# Patient Record
Sex: Female | Born: 1955
Health system: Southern US, Community
[De-identification: ages and names within clinical notes are randomized; demographics above are authoritative.]

## PROBLEM LIST (undated history)

## (undated) DIAGNOSIS — Z860100 Personal history of colon polyps, unspecified: Secondary | ICD-10-CM

## (undated) DIAGNOSIS — IMO0002 Reserved for concepts with insufficient information to code with codable children: Secondary | ICD-10-CM

## (undated) DIAGNOSIS — C4491 Basal cell carcinoma of skin, unspecified: Secondary | ICD-10-CM

## (undated) DIAGNOSIS — I10 Essential (primary) hypertension: Secondary | ICD-10-CM

## (undated) DIAGNOSIS — C801 Malignant (primary) neoplasm, unspecified: Secondary | ICD-10-CM

## (undated) DIAGNOSIS — K5790 Diverticulosis of intestine, part unspecified, without perforation or abscess without bleeding: Secondary | ICD-10-CM

## (undated) DIAGNOSIS — Z9889 Other specified postprocedural states: Secondary | ICD-10-CM

## (undated) DIAGNOSIS — E785 Hyperlipidemia, unspecified: Secondary | ICD-10-CM

## (undated) DIAGNOSIS — Z8619 Personal history of other infectious and parasitic diseases: Secondary | ICD-10-CM

## (undated) DIAGNOSIS — R87619 Unspecified abnormal cytological findings in specimens from cervix uteri: Secondary | ICD-10-CM

## (undated) DIAGNOSIS — Z859 Personal history of malignant neoplasm, unspecified: Secondary | ICD-10-CM

## (undated) HISTORY — DX: Essential (primary) hypertension: I10

## (undated) HISTORY — DX: Malignant (primary) neoplasm, unspecified: C80.1

## (undated) HISTORY — DX: Hyperlipidemia, unspecified: E78.5

## (undated) HISTORY — DX: Basal cell carcinoma of skin, unspecified: C44.91

## (undated) HISTORY — DX: Personal history of other infectious and parasitic diseases: Z86.19

## (undated) HISTORY — PX: SKIN BIOPSY: SHX1

---

## 1991-08-08 HISTORY — PX: TUBAL LIGATION: SHX77

## 2005-12-29 ENCOUNTER — Ambulatory Visit: Payer: Self-pay | Admitting: Gastroenterology

## 2006-01-17 ENCOUNTER — Ambulatory Visit: Payer: Self-pay | Admitting: Gastroenterology

## 2006-06-18 ENCOUNTER — Encounter: Admission: RE | Admit: 2006-06-18 | Discharge: 2006-06-18 | Payer: Self-pay | Admitting: Obstetrics and Gynecology

## 2011-03-20 ENCOUNTER — Encounter: Payer: Self-pay | Admitting: Gastroenterology

## 2011-09-05 ENCOUNTER — Encounter: Payer: Self-pay | Admitting: Gastroenterology

## 2011-09-05 ENCOUNTER — Ambulatory Visit (AMBULATORY_SURGERY_CENTER): Payer: BC Managed Care – PPO

## 2011-09-05 VITALS — Ht 64.0 in | Wt 163.8 lb

## 2011-09-05 DIAGNOSIS — Z800 Family history of malignant neoplasm of digestive organs: Secondary | ICD-10-CM

## 2011-09-05 DIAGNOSIS — Z86010 Personal history of colonic polyps: Secondary | ICD-10-CM

## 2011-09-05 MED ORDER — PEG-KCL-NACL-NASULF-NA ASC-C 100 G PO SOLR: 1.0000 | Freq: Once | ORAL | Status: AC

## 2011-09-08 HISTORY — PX: COLONOSCOPY W/ POLYPECTOMY: SHX1380

## 2011-09-19 ENCOUNTER — Encounter: Payer: Self-pay | Admitting: Gastroenterology

## 2011-09-19 ENCOUNTER — Ambulatory Visit (AMBULATORY_SURGERY_CENTER): Payer: BC Managed Care – PPO | Admitting: Gastroenterology

## 2011-09-19 DIAGNOSIS — D128 Benign neoplasm of rectum: Secondary | ICD-10-CM

## 2011-09-19 DIAGNOSIS — K5730 Diverticulosis of large intestine without perforation or abscess without bleeding: Secondary | ICD-10-CM

## 2011-09-19 DIAGNOSIS — D126 Benign neoplasm of colon, unspecified: Secondary | ICD-10-CM

## 2011-09-19 DIAGNOSIS — D129 Benign neoplasm of anus and anal canal: Secondary | ICD-10-CM

## 2011-09-19 DIAGNOSIS — Z86010 Personal history of colonic polyps: Secondary | ICD-10-CM

## 2011-09-19 DIAGNOSIS — Z1211 Encounter for screening for malignant neoplasm of colon: Secondary | ICD-10-CM

## 2011-09-19 DIAGNOSIS — Z800 Family history of malignant neoplasm of digestive organs: Secondary | ICD-10-CM

## 2011-09-19 MED ORDER — SODIUM CHLORIDE 0.9 % IV SOLN: 500.0000 mL | INTRAVENOUS | Status: DC

## 2011-09-19 NOTE — Op Note (Signed)
[facility] [address] [place], [place]  [address]  COLONOSCOPY PROCEDURE REPORT  PATIENT:  Brianna Conrad, Brianna Conrad  MR#:  [id number] BIRTHDATE:  02-11-1956, 55 yrs. old  GENDER:  female ENDOSCOPIST:  Rachael Fee, MD PROCEDURE DATE:  09/19/2011 PROCEDURE:  Colonoscopy with biopsy ASA CLASS:  Class II INDICATIONS:  Elevated Risk Screening, brother with colon cancer, personal history of HP polyps in 2007 MEDICATIONS:   Fentanyl 75 mcg IV, These medications were titrated to patient response per physician's verbal order, Versed 6 mg IV  DESCRIPTION OF PROCEDURE:   After the risks benefits and alternatives of the procedure were thoroughly explained, informed consent was obtained.  Digital rectal exam was performed and revealed no rectal masses.   The LB 180AL [id number] endoscope was introduced through the anus and advanced to the cecum, which was identified by both the appendix and ileocecal valve, without limitations.  The quality of the prep was good..  The instrument was then slowly withdrawn as the colon was fully examined. <<PROCEDUREIMAGES>> FINDINGS:  A diminutive polyp was found in the transverse colon. This was removed with forceps and sent to pathology (jar 1) (see image3).  Mild diverticulosis was found in the sigmoid to descending colon segments.  This was otherwise a normal examination of the colon (see image1, image2, and image4). Retroflexed views in the rectum revealed no abnormalities. COMPLICATIONS:  None  ENDOSCOPIC IMPRESSION: 1) Diminutive polyp in the transverse colon, this was removed and sent to pathology 2) Mild diverticulosis in the sigmoid to descending colon segments 3) Otherwise normal examination  RECOMMENDATIONS: 1) Given your significant family history of colon cancer, you should have a repeat colonoscopy in 5 years even if the polyp removed today is NOT precancerous. 2) You will receive a letter within 1-2 weeks with the results of your  biopsy as well as final recommendations. Please call my office if you have not received a letter after 3 weeks.  ______________________________ Rachael Fee, MD  cc: Herb Grays, MD  n. Rosalie Doctor:   Rachael Fee at 09/19/2011 09:59 AM  Loralee Pacas, [phone]

## 2011-09-19 NOTE — Patient Instructions (Signed)
Resume medications.Information given on polyps, diverticulosis and high fiber diet. D/C Information reviewed with family.

## 2011-09-19 NOTE — Progress Notes (Signed)
Patient did not experience any of the following events: a burn prior to discharge; a fall within the facility; wrong site/side/patient/procedure/implant event; or a hospital transfer or hospital admission upon discharge from the facility. (G8907) Patient did not have preoperative order for IV antibiotic SSI prophylaxis. (G8918)  

## 2011-09-20 ENCOUNTER — Telehealth: Payer: Self-pay | Admitting: *Deleted

## 2011-09-20 NOTE — Telephone Encounter (Signed)
  Follow up Call-  Call back number 09/19/2011  Post procedure Call Back phone  # [phone]--OK TO LEAVE MESS  Permission to leave phone message Yes     Patient questions:  Do you have a fever, pain , or abdominal swelling? no Pain Score  0 *  Have you tolerated food without any problems? yes  Have you been able to return to your normal activities? yes  Do you have any questions about your discharge instructions: Diet   no Medications  no Follow up visit  no  Do you have questions or concerns about your Care? No   Actions: * If pain score is 4 or above: No action needed, pain <4.

## 2011-09-26 ENCOUNTER — Encounter: Payer: Self-pay | Admitting: Gastroenterology

## 2012-08-07 DIAGNOSIS — C801 Malignant (primary) neoplasm, unspecified: Secondary | ICD-10-CM

## 2012-08-07 HISTORY — DX: Malignant (primary) neoplasm, unspecified: C80.1

## 2013-05-13 ENCOUNTER — Other Ambulatory Visit: Payer: Self-pay | Admitting: Obstetrics and Gynecology

## 2013-05-13 DIAGNOSIS — R928 Other abnormal and inconclusive findings on diagnostic imaging of breast: Secondary | ICD-10-CM

## 2013-05-26 ENCOUNTER — Ambulatory Visit
Admission: RE | Admit: 2013-05-26 | Discharge: 2013-05-26 | Disposition: A | Discharge status: Home / Self Care | Payer: BC Managed Care – PPO | Source: Ambulatory Visit | Attending: Obstetrics and Gynecology | Admitting: Obstetrics and Gynecology

## 2013-05-26 DIAGNOSIS — R928 Other abnormal and inconclusive findings on diagnostic imaging of breast: Secondary | ICD-10-CM

## 2013-06-18 ENCOUNTER — Encounter (HOSPITAL_BASED_OUTPATIENT_CLINIC_OR_DEPARTMENT_OTHER): Payer: Self-pay | Admitting: *Deleted

## 2013-06-18 NOTE — Progress Notes (Signed)
NPO AFTER MN WITH EXCEPTION CLEAR LIQUIDS UNTIL 0800 (NO CREAM/ MILK PRODUCTS). ARRIVE AT 1200. PRE-OP ORDERS PENDING. NEEDS ISTAT AND EKG. WILL TAKE LIPITOR AND TRICOR AM DOS W/ SIPS OF WATER.

## 2013-06-19 MED ORDER — DEXTROSE 5 % IV SOLN
2.0000 g | INTRAVENOUS | Status: AC
  Administered 2013-06-20: 2 g via INTRAVENOUS
  Filled 2013-06-19: qty 2

## 2013-06-19 NOTE — H&P (Addendum)
57 yo WF with pap smear showing AGUS presents for surgical management  PMHx: hypercholesterol & TG, HTN PSHx:  SVD x 3 All:  NKA Meds:  Atorvastatin, fenofibrate, HCTZ SHx: negative tobacco, etoh, ivdu  AF, VSS Gen - NAD ABd - soft, NT CV - RRR Lungs - clear Ext - nt  Pap  - AGUS  A/P:  AGUS pap Hysteroscopy, D&C, colposcopy with possible CKC Plan of care reviewed with pt, informed consent

## 2013-06-20 ENCOUNTER — Encounter (HOSPITAL_COMMUNITY): Payer: Self-pay | Admitting: *Deleted

## 2013-06-20 ENCOUNTER — Encounter (HOSPITAL_COMMUNITY): Payer: BC Managed Care – PPO | Admitting: Anesthesiology

## 2013-06-20 ENCOUNTER — Encounter (HOSPITAL_COMMUNITY)
Admission: RE | Disposition: A | Discharge status: Home / Self Care | Payer: Self-pay | Source: Ambulatory Visit | Attending: Obstetrics and Gynecology

## 2013-06-20 ENCOUNTER — Ambulatory Visit (HOSPITAL_BASED_OUTPATIENT_CLINIC_OR_DEPARTMENT_OTHER)
Admission: RE | Admit: 2013-06-20 | Discharge: 2013-06-20 | Disposition: A | Discharge status: Home / Self Care | Payer: BC Managed Care – PPO | Source: Ambulatory Visit | Attending: Obstetrics and Gynecology | Admitting: Obstetrics and Gynecology

## 2013-06-20 ENCOUNTER — Ambulatory Visit (HOSPITAL_COMMUNITY): Payer: BC Managed Care – PPO | Admitting: Anesthesiology

## 2013-06-20 DIAGNOSIS — N840 Polyp of corpus uteri: Secondary | ICD-10-CM | POA: Insufficient documentation

## 2013-06-20 DIAGNOSIS — R87619 Unspecified abnormal cytological findings in specimens from cervix uteri: Secondary | ICD-10-CM | POA: Insufficient documentation

## 2013-06-20 HISTORY — DX: Personal history of malignant neoplasm, unspecified: Z85.9

## 2013-06-20 HISTORY — DX: Personal history of colon polyps, unspecified: Z86.0100

## 2013-06-20 HISTORY — PX: CERVICAL CONIZATION W/BX: SHX1330

## 2013-06-20 HISTORY — DX: Personal history of malignant neoplasm, unspecified: Z98.890

## 2013-06-20 HISTORY — PX: HYSTEROSCOPY W/D&C: SHX1775

## 2013-06-20 HISTORY — DX: Personal history of colonic polyps: Z86.010

## 2013-06-20 HISTORY — DX: Reserved for concepts with insufficient information to code with codable children: IMO0002

## 2013-06-20 HISTORY — DX: Diverticulosis of intestine, part unspecified, without perforation or abscess without bleeding: K57.90

## 2013-06-20 HISTORY — DX: Unspecified abnormal cytological findings in specimens from cervix uteri: R87.619

## 2013-06-20 LAB — CBC
HCT: 41 % (ref 36.0–46.0)
Hemoglobin: 14.9 g/dL (ref 12.0–15.0)
MCH: 32.2 pg (ref 26.0–34.0)
RBC: 4.63 MIL/uL (ref 3.87–5.11)

## 2013-06-20 SURGERY — DILATATION AND CURETTAGE /HYSTEROSCOPY
Anesthesia: General | Site: Vagina | Wound class: Clean Contaminated

## 2013-06-20 SURGERY — DILATATION AND CURETTAGE /HYSTEROSCOPY
Anesthesia: Choice

## 2013-06-20 MED ORDER — LIDOCAINE HCL (CARDIAC) 20 MG/ML IV SOLN
INTRAVENOUS | Status: DC | PRN
  Administered 2013-06-20: 50 mg via INTRAVENOUS

## 2013-06-20 MED ORDER — ONDANSETRON HCL 4 MG/2ML IJ SOLN
INTRAMUSCULAR | Status: DC | PRN
  Administered 2013-06-20: 4 mg via INTRAVENOUS

## 2013-06-20 MED ORDER — FENTANYL CITRATE 0.05 MG/ML IJ SOLN
INTRAMUSCULAR | Status: DC | PRN
  Administered 2013-06-20: 50 ug via INTRAVENOUS
  Administered 2013-06-20 (×2): 25 ug via INTRAVENOUS

## 2013-06-20 MED ORDER — FENTANYL CITRATE 0.05 MG/ML IJ SOLN: 25.0000 ug | INTRAMUSCULAR | Status: DC | PRN

## 2013-06-20 MED ORDER — LACTATED RINGERS IV SOLN
INTRAVENOUS | Status: DC
  Administered 2013-06-20: 13:00:00 via INTRAVENOUS

## 2013-06-20 MED ORDER — FERRIC SUBSULFATE 259 MG/GM EX SOLN
CUTANEOUS | Status: AC
  Filled 2013-06-20: qty 8

## 2013-06-20 MED ORDER — PROPOFOL 10 MG/ML IV EMUL
INTRAVENOUS | Status: AC
  Filled 2013-06-20: qty 20

## 2013-06-20 MED ORDER — HYDROCODONE-IBUPROFEN 7.5-200 MG PO TABS: 1.0000 | ORAL_TABLET | ORAL | Status: DC | PRN

## 2013-06-20 MED ORDER — ACETAMINOPHEN 160 MG/5ML PO SOLN
ORAL | Status: AC
  Filled 2013-06-20: qty 20.3

## 2013-06-20 MED ORDER — IODINE STRONG (LUGOLS) 5 % PO SOLN
ORAL | Status: AC
  Filled 2013-06-20: qty 1

## 2013-06-20 MED ORDER — ACETAMINOPHEN 160 MG/5ML PO SOLN
960.0000 mg | Freq: Once | ORAL | Status: AC
  Administered 2013-06-20: 960 mg via ORAL

## 2013-06-20 MED ORDER — LACTATED RINGERS IR SOLN
Status: DC | PRN
  Administered 2013-06-20: 1

## 2013-06-20 MED ORDER — DEXAMETHASONE SODIUM PHOSPHATE 10 MG/ML IJ SOLN
INTRAMUSCULAR | Status: AC
  Filled 2013-06-20: qty 1

## 2013-06-20 MED ORDER — FENTANYL CITRATE 0.05 MG/ML IJ SOLN
INTRAMUSCULAR | Status: AC
  Filled 2013-06-20: qty 2

## 2013-06-20 MED ORDER — DEXAMETHASONE SODIUM PHOSPHATE 10 MG/ML IJ SOLN
INTRAMUSCULAR | Status: DC | PRN
  Administered 2013-06-20: 10 mg via INTRAVENOUS

## 2013-06-20 MED ORDER — LIDOCAINE HCL 1 % IJ SOLN
INTRAMUSCULAR | Status: DC | PRN
  Administered 2013-06-20: 10 mL

## 2013-06-20 MED ORDER — LIDOCAINE HCL 1 % IJ SOLN
INTRAMUSCULAR | Status: AC
  Filled 2013-06-20: qty 20

## 2013-06-20 MED ORDER — LIDOCAINE HCL (CARDIAC) 20 MG/ML IV SOLN
INTRAVENOUS | Status: AC
  Filled 2013-06-20: qty 5

## 2013-06-20 MED ORDER — MIDAZOLAM HCL 2 MG/2ML IJ SOLN
INTRAMUSCULAR | Status: AC
  Filled 2013-06-20: qty 2

## 2013-06-20 MED ORDER — LIDOCAINE-EPINEPHRINE 1 %-1:100000 IJ SOLN
INTRAMUSCULAR | Status: AC
  Filled 2013-06-20: qty 1

## 2013-06-20 MED ORDER — ACETIC ACID 5 % SOLN
Status: AC
  Filled 2013-06-20: qty 500

## 2013-06-20 MED ORDER — MIDAZOLAM HCL 5 MG/5ML IJ SOLN
INTRAMUSCULAR | Status: DC | PRN
  Administered 2013-06-20: 2 mg via INTRAVENOUS

## 2013-06-20 MED ORDER — PROPOFOL 10 MG/ML IV BOLUS
INTRAVENOUS | Status: DC | PRN
  Administered 2013-06-20: 200 mg via INTRAVENOUS

## 2013-06-20 MED ORDER — ONDANSETRON HCL 4 MG/2ML IJ SOLN
INTRAMUSCULAR | Status: AC
  Filled 2013-06-20: qty 2

## 2013-06-20 SURGICAL SUPPLY — 36 items
ABLATOR ENDOMETRIAL BIPOLAR (ABLATOR) IMPLANT
APPLICATOR COTTON TIP 6IN STRL (MISCELLANEOUS) IMPLANT
BLADE SURG 11 STRL SS (BLADE) ×2 IMPLANT
CANISTER SUCT 3000ML (MISCELLANEOUS) ×2 IMPLANT
CATH ROBINSON RED A/P 16FR (CATHETERS) ×2 IMPLANT
CATH THERMACHOICE III (CATHETERS) IMPLANT
CLOTH BEACON ORANGE TIMEOUT ST (SAFETY) ×2 IMPLANT
CONTAINER PREFILL 10% NBF 60ML (FORM) ×4 IMPLANT
COUNTER NEEDLE 1200 MAGNETIC (NEEDLE) IMPLANT
DRAPE HYSTEROSCOPY (DRAPE) ×2 IMPLANT
DRESSING TELFA 8X3 (GAUZE/BANDAGES/DRESSINGS) ×2 IMPLANT
ELECT BALL LEEP 5MM RED (ELECTRODE) ×4 IMPLANT
ELECT REM PT RETURN 9FT ADLT (ELECTROSURGICAL) ×2
ELECTRODE REM PT RTRN 9FT ADLT (ELECTROSURGICAL) ×1 IMPLANT
GLOVE BIO SURGEON STRL SZ 6.5 (GLOVE) ×2 IMPLANT
GLOVE BIOGEL PI IND STRL 7.0 (GLOVE) ×1 IMPLANT
GLOVE BIOGEL PI INDICATOR 7.0 (GLOVE) ×1
GOWN STRL REIN XL XLG (GOWN DISPOSABLE) ×4 IMPLANT
LOOP ANGLED CUTTING 22FR (CUTTING LOOP) IMPLANT
NEEDLE SPNL 22GX3.5 QUINCKE BK (NEEDLE) ×2 IMPLANT
NS IRRIG 1000ML POUR BTL (IV SOLUTION) ×2 IMPLANT
PACK HYSTEROSCOPY LF (CUSTOM PROCEDURE TRAY) IMPLANT
PACK VAGINAL MINOR WOMEN LF (CUSTOM PROCEDURE TRAY) ×6 IMPLANT
PAD OB MATERNITY 4.3X12.25 (PERSONAL CARE ITEMS) ×2 IMPLANT
PENCIL BUTTON HOLSTER BLD 10FT (ELECTRODE) ×2 IMPLANT
SCOPETTES 8  STERILE (MISCELLANEOUS)
SCOPETTES 8 STERILE (MISCELLANEOUS) IMPLANT
SPONGE SURGIFOAM ABS GEL 12-7 (HEMOSTASIS) IMPLANT
SUT VIC AB 0 CT1 27 (SUTURE) ×1
SUT VIC AB 0 CT1 27XBRD ANBCTR (SUTURE) ×1 IMPLANT
SUT VIC AB 2-0 CT1 (SUTURE) ×4 IMPLANT
SYR CONTROL 10ML LL (SYRINGE) ×2 IMPLANT
TOWEL OR 17X24 6PK STRL BLUE (TOWEL DISPOSABLE) ×4 IMPLANT
TUBING NON-CON 1/4 X 20 CONN (TUBING) IMPLANT
WATER STERILE IRR 1000ML POUR (IV SOLUTION) IMPLANT
YANKAUER SUCT BULB TIP NO VENT (SUCTIONS) ×2 IMPLANT

## 2013-06-20 NOTE — Transfer of Care (Signed)
Immediate Anesthesia Transfer of Care Note  Patient: Brianna Conrad  Procedure(s) Performed: Procedure(s): DILATATION AND CURETTAGE /HYSTEROSCOPY (N/A) CONIZATION CERVIX WITH BIOPSY (N/A)  Patient Location: PACU  Anesthesia Type:General  Level of Consciousness: awake, alert  and oriented  Airway & Oxygen Therapy: Patient Spontanous Breathing and Patient connected to nasal cannula oxygen  Post-op Assessment: Report given to PACU RN and Post -op Vital signs reviewed and stable  Post vital signs: Reviewed and stable  Complications: No apparent anesthesia complications

## 2013-06-20 NOTE — Anesthesia Postprocedure Evaluation (Signed)
  Anesthesia Post-op Note  Patient: Brianna Conrad  Procedure(s) Performed: Procedure(s): DILATATION AND CURETTAGE /HYSTEROSCOPY (N/A) CONIZATION CERVIX WITH BIOPSY (N/A)  Patient Location: PACU  Anesthesia Type:General  Level of Consciousness: awake, alert  and oriented  Airway and Oxygen Therapy: Patient Spontanous Breathing  Post-op Pain: none  Post-op Assessment: Post-op Vital signs reviewed, Patient's Cardiovascular Status Stable, Respiratory Function Stable, Patent Airway, No signs of Nausea or vomiting and Pain level controlled  Post-op Vital Signs: Reviewed and stable  Complications: No apparent anesthesia complications

## 2013-06-20 NOTE — Anesthesia Preprocedure Evaluation (Signed)
Anesthesia Evaluation  Patient identified by MRN, date of birth, ID band Patient awake    Reviewed: Allergy & Precautions, H&P , Patient's Chart, lab work & pertinent test results, reviewed documented beta blocker date and time   Airway Mallampati: II TM Distance: >3 FB Neck ROM: full    Dental no notable dental hx.    Pulmonary  breath sounds clear to auscultation  Pulmonary exam normal       Cardiovascular hypertension, On Medications Rhythm:regular Rate:Normal     Neuro/Psych    GI/Hepatic   Endo/Other    Renal/GU      Musculoskeletal   Abdominal   Peds  Hematology   Anesthesia Other Findings   Reproductive/Obstetrics                           Anesthesia Physical Anesthesia Plan  ASA: II  Anesthesia Plan:    Post-op Pain Management:    Induction: Intravenous  Airway Management Planned: LMA  Additional Equipment:   Intra-op Plan:   Post-operative Plan:   Informed Consent: I have reviewed the patients History and Physical, chart, labs and discussed the procedure including the risks, benefits and alternatives for the proposed anesthesia with the patient or authorized representative who has indicated his/her understanding and acceptance.   Dental Advisory Given and Dental advisory given  Plan Discussed with: CRNA and Surgeon  Anesthesia Plan Comments:         Anesthesia Quick Evaluation

## 2013-06-20 NOTE — Preoperative (Signed)
Beta Blockers   Reason not to administer Beta Blockers:Not Applicable 

## 2013-06-21 NOTE — Op Note (Signed)
NAMEAMELIA, Brianna Conrad               ACCOUNT NO.:  [id number]  MEDICAL RECORD NO.:  [id number]  LOCATION:  [facility]                          FACILITY:  [facility]  PHYSICIAN:  Zelphia Cairo, MD    DATE OF BIRTH:  [birth year]  DATE OF PROCEDURE:  06/20/2013 DATE OF DISCHARGE:  06/20/2013                              OPERATIVE REPORT   PREOPERATIVE DIAGNOSIS:  Atypical glandular cells of undetermined significance on her Pap smear.  POSTOPERATIVE DIAGNOSIS:  Atypical glandular cells of undetermined significance on her Pap smear, pathology pending.  PROCEDURE: 1. Cervical block. 2. Colposcopy with cervical biopsy. 3. Endocervical curettage. 4. Hysteroscopy. 5. Dilation and curettage. 6. Cone biopsy of cervix.  SURGEON:  Zelphia Cairo, MD  ANESTHESIA:  General with local.  COMPLICATIONS:  None.  SPECIMEN: 1. Endocervical curettage. 2. Cervical biopsy. 3. Endometrial curettage. 4. Cone biopsy of cervix.  CONDITION:  Stable to recovery room.  FLUID DEFICIT:  40 mL.  PROCEDURE:  The patient was taken to the operating room after informed consent was obtained.  She received general anesthesia and was placed in the dorsal lithotomy position using Allen stirrups.  An in- and out catheter was used to drain her bladder for an unmeasured amount of urine.  A speculum was placed in the vagina and the cervix was cleansed with acetic acid solution.  Colposcopy was performed.  The entire cervix could be well visualized.  The transition zone could not be well identified.  She had a hemorrhagic type lesion at 7 o'clock of the cervix which was biopsied and this appeared what could be almost looked like a nabothian cyst.  No acetowhite changes were noted, and the cervical curettage was performed.  The speculum was removed, and the patient was sterilely prepped with Betadine.  A sterile speculum was reinserted in the vagina and a cervical block was performed using 1% lidocaine plain.  A  single-tooth tenaculum was attached to the anterior lip of the cervix and the cervix was dilated using Pratt dilators.  Diagnostic hysteroscope was inserted and a survey was performed.  The endometrial lining did appear somewhat thickened but no visual masses were identified.  The hysteroscope was removed and a systematic curetting was performed throughout the endometrial cavity.  Specimen was placed on Telfa and passed off to be sent to pathology.  Decision was made to perform a cone biopsy.  The speculum was removed and a weighted speculum was placed in the posterior vagina.  A Deaver was placed anterior.  Retention sutures were placed at 3 o'clock and 9 o'clock.  A circumferential incision was made with a scalpel, and the cervical specimen was excised at the base using curved Mayo scissors.  The base and edges were then well cauterized using the Bovie.  Retention sutures were tied together.  The cervix was hemostatic.  Retractors were removed.  The patient was extubated and taken to the recovery room in stable condition. Sponge, lap, needle and instrument counts were correct x2.     Zelphia Cairo, MD     GA/MEDQ  D:  06/20/2013  T:  06/21/2013  Job:  [id number]

## 2013-06-23 ENCOUNTER — Encounter (HOSPITAL_COMMUNITY): Payer: Self-pay | Admitting: Obstetrics and Gynecology

## 2014-06-15 ENCOUNTER — Other Ambulatory Visit: Payer: Self-pay | Admitting: Obstetrics and Gynecology

## 2014-06-16 LAB — CYTOLOGY - PAP

## 2014-06-24 ENCOUNTER — Other Ambulatory Visit: Payer: Self-pay | Admitting: Obstetrics and Gynecology

## 2015-04-08 ENCOUNTER — Encounter: Payer: Self-pay | Admitting: Gastroenterology

## 2015-07-08 ENCOUNTER — Other Ambulatory Visit: Payer: Self-pay | Admitting: Obstetrics and Gynecology

## 2015-07-08 DIAGNOSIS — N644 Mastodynia: Secondary | ICD-10-CM

## 2015-07-14 ENCOUNTER — Ambulatory Visit
Admission: RE | Admit: 2015-07-14 | Discharge: 2015-07-14 | Disposition: A | Discharge status: Home / Self Care | Payer: BLUE CROSS/BLUE SHIELD | Source: Ambulatory Visit | Attending: Obstetrics and Gynecology | Admitting: Obstetrics and Gynecology

## 2015-07-14 DIAGNOSIS — N644 Mastodynia: Secondary | ICD-10-CM

## 2015-08-11 ENCOUNTER — Encounter: Payer: Self-pay | Admitting: Family Medicine

## 2015-08-11 ENCOUNTER — Ambulatory Visit (INDEPENDENT_AMBULATORY_CARE_PROVIDER_SITE_OTHER): Payer: BLUE CROSS/BLUE SHIELD | Admitting: Family Medicine

## 2015-08-11 VITALS — BP 164/79 | HR 74 | Temp 98.6°F | Resp 20 | Ht 64.0 in | Wt 167.8 lb

## 2015-08-11 DIAGNOSIS — I10 Essential (primary) hypertension: Secondary | ICD-10-CM | POA: Insufficient documentation

## 2015-08-11 DIAGNOSIS — E785 Hyperlipidemia, unspecified: Secondary | ICD-10-CM | POA: Diagnosis not present

## 2015-08-11 DIAGNOSIS — Z7689 Persons encountering health services in other specified circumstances: Secondary | ICD-10-CM | POA: Insufficient documentation

## 2015-08-11 DIAGNOSIS — Z7189 Other specified counseling: Secondary | ICD-10-CM

## 2015-08-11 DIAGNOSIS — Z0000 Encounter for general adult medical examination without abnormal findings: Secondary | ICD-10-CM | POA: Insufficient documentation

## 2015-08-11 MED ORDER — HYDROCHLOROTHIAZIDE 25 MG PO TABS: 25.0000 mg | ORAL_TABLET | Freq: Every day | ORAL | Status: DC

## 2015-08-11 NOTE — Patient Instructions (Signed)
Health Maintenance, Female Adopting a healthy lifestyle and getting preventive care can go a long way to promote health and wellness. Talk with your health care provider about what schedule of regular examinations is right for you. This is a good chance for you to check in with your provider about disease prevention and staying healthy. In between checkups, there are plenty of things you can do on your own. Experts have done a lot of research about which lifestyle changes and preventive measures are most likely to keep you healthy. Ask your health care provider for more information. WEIGHT AND DIET  Eat a healthy diet  Be sure to include plenty of vegetables, fruits, low-fat dairy products, and lean protein.  Do not eat a lot of foods high in solid fats, added sugars, or salt.  Get regular exercise. This is one of the most important things you can do for your health.  Most adults should exercise for at least 150 minutes each week. The exercise should increase your heart rate and make you sweat (moderate-intensity exercise).  Most adults should also do strengthening exercises at least twice a week. This is in addition to the moderate-intensity exercise.  Maintain a healthy weight  Body mass index (BMI) is a measurement that can be used to identify possible weight problems. It estimates body fat based on height and weight. Your health care provider can help determine your BMI and help you achieve or maintain a healthy weight.  For females 20 years of age and older:   A BMI below 18.5 is considered underweight.  A BMI of 18.5 to 24.9 is normal.  A BMI of 25 to 29.9 is considered overweight.  A BMI of 30 and above is considered obese.  Watch levels of cholesterol and blood lipids  You should start having your blood tested for lipids and cholesterol at 59 years of age, then have this test every 5 years.  You may need to have your cholesterol levels checked more often if:  Your lipid  or cholesterol levels are high.  You are older than 59 years of age.  You are at high risk for heart disease.  CANCER SCREENING   Lung Cancer  Lung cancer screening is recommended for adults 55-80 years old who are at high risk for lung cancer because of a history of smoking.  A yearly low-dose CT scan of the lungs is recommended for people who:  Currently smoke.  Have quit within the past 15 years.  Have at least a 30-pack-year history of smoking. A pack year is smoking an average of one pack of cigarettes a day for 1 year.  Yearly screening should continue until it has been 15 years since you quit.  Yearly screening should stop if you develop a health problem that would prevent you from having lung cancer treatment.  Breast Cancer  Practice breast self-awareness. This means understanding how your breasts normally appear and feel.  It also means doing regular breast self-exams. Let your health care provider know about any changes, no matter how small.  If you are in your 20s or 30s, you should have a clinical breast exam (CBE) by a health care provider every 1-3 years as part of a regular health exam.  If you are 40 or older, have a CBE every year. Also consider having a breast X-ray (mammogram) every year.  If you have a family history of breast cancer, talk to your health care provider about genetic screening.  If you   are at high risk for breast cancer, talk to your health care provider about having an MRI and a mammogram every year.  Breast cancer gene (BRCA) assessment is recommended for women who have family members with BRCA-related cancers. BRCA-related cancers include:  Breast.  Ovarian.  Tubal.  Peritoneal cancers.  Results of the assessment will determine the need for genetic counseling and BRCA1 and BRCA2 testing. Cervical Cancer Your health care provider may recommend that you be screened regularly for cancer of the pelvic organs (ovaries, uterus, and  vagina). This screening involves a pelvic examination, including checking for microscopic changes to the surface of your cervix (Pap test). You may be encouraged to have this screening done every 3 years, beginning at age 21.  For women ages 30-65, health care providers may recommend pelvic exams and Pap testing every 3 years, or they may recommend the Pap and pelvic exam, combined with testing for human papilloma virus (HPV), every 5 years. Some types of HPV increase your risk of cervical cancer. Testing for HPV may also be done on women of any age with unclear Pap test results.  Other health care providers may not recommend any screening for nonpregnant women who are considered low risk for pelvic cancer and who do not have symptoms. Ask your health care provider if a screening pelvic exam is right for you.  If you have had past treatment for cervical cancer or a condition that could lead to cancer, you need Pap tests and screening for cancer for at least 20 years after your treatment. If Pap tests have been discontinued, your risk factors (such as having a new sexual partner) need to be reassessed to determine if screening should resume. Some women have medical problems that increase the chance of getting cervical cancer. In these cases, your health care provider may recommend more frequent screening and Pap tests. Colorectal Cancer  This type of cancer can be detected and often prevented.  Routine colorectal cancer screening usually begins at 59 years of age and continues through 59 years of age.  Your health care provider may recommend screening at an earlier age if you have risk factors for colon cancer.  Your health care provider may also recommend using home test kits to check for hidden blood in the stool.  A small camera at the end of a tube can be used to examine your colon directly (sigmoidoscopy or colonoscopy). This is done to check for the earliest forms of colorectal  cancer.  Routine screening usually begins at age 50.  Direct examination of the colon should be repeated every 5-10 years through 59 years of age. However, you may need to be screened more often if early forms of precancerous polyps or small growths are found. Skin Cancer  Check your skin from head to toe regularly.  Tell your health care provider about any new moles or changes in moles, especially if there is a change in a mole's shape or color.  Also tell your health care provider if you have a mole that is larger than the size of a pencil eraser.  Always use sunscreen. Apply sunscreen liberally and repeatedly throughout the day.  Protect yourself by wearing long sleeves, pants, a wide-brimmed hat, and sunglasses whenever you are outside. HEART DISEASE, DIABETES, AND HIGH BLOOD PRESSURE   High blood pressure causes heart disease and increases the risk of stroke. High blood pressure is more likely to develop in:  People who have blood pressure in the high end   of the normal range (130-139/85-89 mm Hg).  People who are overweight or obese.  People who are African American.  If you are 38-23 years of age, have your blood pressure checked every 3-5 years. If you are 61 years of age or older, have your blood pressure checked every year. You should have your blood pressure measured twice--once when you are at a hospital or clinic, and once when you are not at a hospital or clinic. Record the average of the two measurements. To check your blood pressure when you are not at a hospital or clinic, you can use:  An automated blood pressure machine at a pharmacy.  A home blood pressure monitor.  If you are between 45 years and 39 years old, ask your health care provider if you should take aspirin to prevent strokes.  Have regular diabetes screenings. This involves taking a blood sample to check your fasting blood sugar level.  If you are at a normal weight and have a low risk for diabetes,  have this test once every three years after 59 years of age.  If you are overweight and have a high risk for diabetes, consider being tested at a younger age or more often. PREVENTING INFECTION  Hepatitis B  If you have a higher risk for hepatitis B, you should be screened for this virus. You are considered at high risk for hepatitis B if:  You were born in a country where hepatitis B is common. Ask your health care provider which countries are considered high risk.  Your parents were born in a high-risk country, and you have not been immunized against hepatitis B (hepatitis B vaccine).  You have HIV or AIDS.  You use needles to inject street drugs.  You live with someone who has hepatitis B.  You have had sex with someone who has hepatitis B.  You get hemodialysis treatment.  You take certain medicines for conditions, including cancer, organ transplantation, and autoimmune conditions. Hepatitis C  Blood testing is recommended for:  Everyone born from [birth year] through 1964.  Anyone with known risk factors for hepatitis C. Sexually transmitted infections (STIs)  You should be screened for sexually transmitted infections (STIs) including gonorrhea and chlamydia if:  You are sexually active and are younger than 59 years of age.  You are older than 59 years of age and your health care provider tells you that you are at risk for this type of infection.  Your sexual activity has changed since you were last screened and you are at an increased risk for chlamydia or gonorrhea. Ask your health care provider if you are at risk.  If you do not have HIV, but are at risk, it may be recommended that you take a prescription medicine daily to prevent HIV infection. This is called pre-exposure prophylaxis (PrEP). You are considered at risk if:  You are sexually active and do not regularly use condoms or know the HIV status of your partner(s).  You take drugs by injection.  You are sexually  active with a partner who has HIV. Talk with your health care provider about whether you are at high risk of being infected with HIV. If you choose to begin PrEP, you should first be tested for HIV. You should then be tested every 3 months for as long as you are taking PrEP.  PREGNANCY   If you are premenopausal and you may become pregnant, ask your health care provider about preconception counseling.  If you may  become pregnant, take 400 to 800 micrograms (mcg) of folic acid every day.  If you want to prevent pregnancy, talk to your health care provider about birth control (contraception). OSTEOPOROSIS AND MENOPAUSE   Osteoporosis is a disease in which the bones lose minerals and strength with aging. This can result in serious bone fractures. Your risk for osteoporosis can be identified using a bone density scan.  If you are 67 years of age or older, or if you are at risk for osteoporosis and fractures, ask your health care provider if you should be screened.  Ask your health care provider whether you should take a calcium or vitamin D supplement to lower your risk for osteoporosis.  Menopause may have certain physical symptoms and risks.  Hormone replacement therapy may reduce some of these symptoms and risks. Talk to your health care provider about whether hormone replacement therapy is right for you.  HOME CARE INSTRUCTIONS   Schedule regular health, dental, and eye exams.  Stay current with your immunizations.   Do not use any tobacco products including cigarettes, chewing tobacco, or electronic cigarettes.  If you are pregnant, do not drink alcohol.  If you are breastfeeding, limit how much and how often you drink alcohol.  Limit alcohol intake to no more than 1 drink per day for nonpregnant women. One drink equals 12 ounces of beer, 5 ounces of wine, or 1 ounces of hard liquor.  Do not use street drugs.  Do not share needles.  Ask your health care provider for help if  you need support or information about quitting drugs.  Tell your health care provider if you often feel depressed.  Tell your health care provider if you have ever been abused or do not feel safe at home.   This information is not intended to replace advice given to you by your health care provider. Make sure you discuss any questions you have with your health care provider.   Document Released: 02/06/2011 Document Revised: 08/14/2014 Document Reviewed: 06/25/2013 Elsevier Interactive Patient Education [employer].  Within the next week please have labs collected fasting. I will call you with those results once available.

## 2015-08-11 NOTE — Progress Notes (Signed)
Subjective:    Patient ID: Brianna Conrad, female    DOB: [birth year], 60 y.o.   MRN: [id number]  HPI   Patient presents for new patient establishment with no complaints. All past medical history, surgical history, allergies, family history, immunizations and social history was obtained from the patient today and entered into the electronic medical record. Records are requested from her prior PCP, and will be reviewed at the time they are received. All medical records will be updated at that time.  She has a past medical history of hypertension, and which she states she takes HCTZ 25 mg daily. Patient reports she has white coat syndrome, and her blood pressure is always elevated when attending doctor. She does check her blood pressure in the outpatient setting and states it's always "normal. "She does exercise but does not watch her diet very closely. She denies any chest pain, shortness of breath, visual changes, headaches or lower extremity edema.  Patient also has a history of hyperlipidemia, and she states she has not been taking either her medications. She was prescribed Lipitor 10 mg daily and fenofibrate 54 mg daily. Patient states all of her lab work was collected approximately 1 year ago in January. Patient is not fasting today. Patient does exercise, but does not watch her diet very closely.  Health maintenance:  Colonoscopy: fhx brother 3, personal history of colon polyps. Last colonoscopy 2013, told to return in 5 years. Due in 2018. Mammogram: No fhx, no abnl mammogram (has cyst). Last mammogram December 2016, continue every 2 year screening. Cervical cancer screening: Last Pap smear 2016. Records indicate ASCUS, negative HPV in the past. Patient follows with gynecology. Immunizations: Patient states she does not believe in a flu shot. Unknown tetanus or pneumonia vaccinations. Zostavax age 49, indicated. Infectious disease screening: Unknown infectious disease screening. requested  records. Patient has dental home.  Past Medical History  Diagnosis Date  . History of squamous cell carcinoma excision     RIGHT SHOULDER  . Atypical glandular cells on Pap smear   . History of colon polyps     BENIGN  2013  . Diverticulosis   . Vaginal delivery [id number], 1993  . Cancer ([place]) 2014    basal cell skin cancer  . History of chicken pox   . Hypertension   . Hyperlipidemia    Allergies  Allergen Reactions  . Amoxicillin Rash  . Penicillins Rash   Past Surgical History  Procedure Laterality Date  . Colonoscopy w/ polypectomy  09/2011  . Tubal ligation  1993    POST PARTUM  . Hysteroscopy w/d&c N/A 06/20/2013    Procedure: DILATATION AND CURETTAGE /HYSTEROSCOPY;  Surgeon: [name], MD;  Location: [facility];  Service: Gynecology;  Laterality: N/A;  . Cervical conization w/bx N/A 06/20/2013    Procedure: CONIZATION CERVIX WITH BIOPSY;  Surgeon: [name], MD;  Location: [facility];  Service: Gynecology;  Laterality: N/A;   Family History  Problem Relation Age of Onset  . Diabetes Father   . Colon cancer Brother 80  . Diabetes Paternal Uncle   . Diabetes Paternal Grandmother   . Stroke Paternal Grandfather    Social History   Social History  . Marital Status: Married    Spouse Name: N/A  . Number of Children: N/A  . Years of Education: N/A   Occupational History  . Not on file.   Social History Main Topics  . Smoking status: Never Smoker   . Smokeless tobacco: Never Used  .  Alcohol Use: Yes     Comment: RARE  . Drug Use: No  . Sexual Activity: Yes    Birth Control/ Protection: Surgical     Comment: Tubal ligation   Other Topics Concern  . Not on file   Social History Narrative   Married, [name]. 3 children.   Retired Film/video editor). College degree.    Drinks caffeinated beverages.   Wears her seatbelt, exercises at least 3 times a week, smoke detectors in the home.   No firearms in the home.   Feels safe in her relationships.      Review of Systems Negative, with the exception of above mentioned in HPI     Objective:   Physical Exam BP 164/79 mmHg  Pulse 74  Temp(Src) 98.6 F (37 C)  Resp 20  Ht 5\' 4"  (1.626 m)  Wt 167 lb 12 oz (76.091 kg)  BMI 28.78 kg/m2  SpO2 98%  LMP 08/07/2015 Gen: Afebrile. No acute distress. Nontoxic in appearance, well-developed, well-nourished, Caucasian female, pleasant. HENT: AT. [place]. Bilateral TM visualized and normal in appearance. MMM, no oral lesions.. Bilateral nares without erythema or swelling. Throat without erythema or exudates. Good dentition. Cough on exam. No hoarseness on exam. Eyes:Pupils Equal Round Reactive to light, Extraocular movements intact,  Conjunctiva without redness, discharge or icterus. Neck/lymp/endocrine: Supple, no lymphadenopathy, no thyromegaly CV: RRR no murmur appreciated, no edema, +2/4 P posterior tibialis pulses Chest: CTAB, no wheeze or crackles. Good air movement, normal respiratory effort. Abd: Soft. flAt. NTND. BS present. No Masses palpated. No rebound or guarding, no hepatosplenomegaly. MSK: No obvious deformities, no erythema, no soft tissue swelling, full range of motion. Neurovascularly intact distally. Skin: No rashes, purpura or petechiae.  Neuro:  Normal gait. PERLA. EOMi. Alert. Oriented x3 Cranial nerves II through XII intact. Muscle strength 5/5 upper and lower extremity. Psych: Normal affect, dress and demeanor. Normal speech. Normal thought content and judgment    Assessment & Plan:  Brianna Conrad is a 60 y.o. female present for establishment of care and annual physical. 1. Essential hypertension, benign - Patient encouraged to continue her HCTZ 25 mg daily, low-salt diet, continue exercising greater than 150 minutes a week. - Patient to monitor blood pressure at home, if elevated above 140/90 she is to make an appointment sooner to discuss her blood pressure regimen. - Future order CBC, CMP, TSH  2. Hyperlipidemia LDL  goal <100 - Patient currently not on medications. Unknown lipid panel levels.  - Patient to obtain lipid panel once fasting, scheduling a lab appointment. Future orders placed. - Future order lipid panel and A1c  3 Encounter for preventive health examination Health maintenance:  Colonoscopy: fhx brother 29, personal history of colon polyps. Last colonoscopy 2013, told to return in 5 years. Due in 2018. Mammogram: No fhx, no abnl mammogram (has cyst). Last mammogram December 2016, continue every 2 year screening. Cervical cancer screening: Last Pap smear 2016. Records indicate ASCUS, negative HPV in the past. Patient follows with gynecology. Immunizations: Patient states she does not believe in a flu shot. Unknown tetanus or pneumonia vaccinations. Zostavax age 23, indicated. Infectious disease screening: Unknown infectious disease screening. requested records. AVS on health maintenance parotid patient today. Patient was encouraged to take at least 800 units of vitamin D daily. Exercise greater than 150 minutes a week. Monitor her diet closely with low salt, low saturated fats, plenty of fresh fruits and vegetables. Once records are received, will update immunizations by nurse visit.

## 2015-08-16 ENCOUNTER — Other Ambulatory Visit: Payer: BLUE CROSS/BLUE SHIELD

## 2015-08-16 ENCOUNTER — Telehealth: Payer: Self-pay | Admitting: Family Medicine

## 2015-08-16 NOTE — Telephone Encounter (Signed)
Please call pt: 0 I have received her records and it does appear she is in need of the TDAP vaccination. If she desires this immunization she can receive by nurse visit.

## 2015-08-17 NOTE — Telephone Encounter (Signed)
Left message for patient on voicemail.

## 2015-08-18 ENCOUNTER — Telehealth: Payer: Self-pay | Admitting: Family Medicine

## 2015-08-18 ENCOUNTER — Other Ambulatory Visit (INDEPENDENT_AMBULATORY_CARE_PROVIDER_SITE_OTHER): Payer: BLUE CROSS/BLUE SHIELD

## 2015-08-18 DIAGNOSIS — Z23 Encounter for immunization: Secondary | ICD-10-CM | POA: Diagnosis not present

## 2015-08-18 DIAGNOSIS — Z7689 Persons encountering health services in other specified circumstances: Secondary | ICD-10-CM

## 2015-08-18 DIAGNOSIS — I10 Essential (primary) hypertension: Secondary | ICD-10-CM

## 2015-08-18 DIAGNOSIS — Z7189 Other specified counseling: Secondary | ICD-10-CM | POA: Diagnosis not present

## 2015-08-18 DIAGNOSIS — E785 Hyperlipidemia, unspecified: Secondary | ICD-10-CM

## 2015-08-18 DIAGNOSIS — Z0000 Encounter for general adult medical examination without abnormal findings: Secondary | ICD-10-CM

## 2015-08-18 LAB — COMPREHENSIVE METABOLIC PANEL
ALT: 24 U/L (ref 0–35)
AST: 17 U/L (ref 0–37)
Albumin: 4.5 g/dL (ref 3.5–5.2)
Alkaline Phosphatase: 52 U/L (ref 39–117)
BILIRUBIN TOTAL: 0.9 mg/dL (ref 0.2–1.2)
BUN: 23 mg/dL (ref 6–23)
CALCIUM: 9.8 mg/dL (ref 8.4–10.5)
CO2: 34 meq/L — AB (ref 19–32)
Chloride: 100 mEq/L (ref 96–112)
Creatinine, Ser: 0.83 mg/dL (ref 0.40–1.20)
GFR: 74.95 mL/min (ref >60.00)
Glucose, Bld: 100 mg/dL — ABNORMAL HIGH (ref 70–99)
Potassium: 4.1 mEq/L (ref 3.5–5.1)
Sodium: 140 mEq/L (ref 135–145)
Total Protein: 6.7 g/dL (ref 6.0–8.3)

## 2015-08-18 LAB — LIPID PANEL
Cholesterol: 272 mg/dL — ABNORMAL HIGH (ref 0–200)
HDL: 47.9 mg/dL (ref >39.00)
LDL CALC: 185 mg/dL — AB (ref 0–99)
NONHDL: 224.03
TRIGLYCERIDES: 197 mg/dL — AB (ref 0.0–149.0)
Total CHOL/HDL Ratio: 6
VLDL: 39.4 mg/dL (ref 0.0–40.0)

## 2015-08-18 LAB — CBC WITH DIFFERENTIAL/PLATELET
BASOS ABS: 0 10*3/uL (ref 0.0–0.1)
Basophils Relative: 0.2 % (ref 0.0–3.0)
Eosinophils Absolute: 0.1 10*3/uL (ref 0.0–0.7)
Eosinophils Relative: 1.3 % (ref 0.0–5.0)
HEMATOCRIT: 44 % (ref 36.0–46.0)
Hemoglobin: 14.7 g/dL (ref 12.0–15.0)
LYMPHS ABS: 2.3 10*3/uL (ref 0.7–4.0)
Lymphocytes Relative: 36.7 % (ref 12.0–46.0)
MCHC: 33.4 g/dL (ref 30.0–36.0)
MCV: 93.9 fl (ref 78.0–100.0)
MONO ABS: 0.3 10*3/uL (ref 0.1–1.0)
MONOS PCT: 5.5 % (ref 3.0–12.0)
NEUTROS ABS: 3.6 10*3/uL (ref 1.4–7.7)
Neutrophils Relative %: 56.3 % (ref 43.0–77.0)
Platelets: 218 10*3/uL (ref 150.0–400.0)
RBC: 4.68 Mil/uL (ref 3.87–5.11)
RDW: 12.2 % (ref 11.5–15.5)
WBC: 6.4 10*3/uL (ref 4.0–10.5)

## 2015-08-18 LAB — TSH: TSH: 2.2 u[IU]/mL (ref 0.35–4.50)

## 2015-08-18 LAB — HEMOGLOBIN A1C: HEMOGLOBIN A1C: 5.3 % (ref 4.6–6.5)

## 2015-08-18 NOTE — Telephone Encounter (Signed)
Pt came in 08/18/15 for labs and requested TDAP.  TDAP given per telephone note.

## 2015-08-18 NOTE — Telephone Encounter (Signed)
Please call patient: Her labs all appeared normal with the exception of her cholesterol.  - She had been on medications by her prior PCP, and she should be medicated with a statin. She was on low-dose Lipitor 10 mg by prior PCP notes, the patient states that she was not taking this routinely during her office visit. - She had also been on a low-dose fenofibrate for her elevated triglycerides, and I do not believe she was taking this either. Her triglycerides are elevated again today. - At the very least she can start the statin medication again, increase her exercise to greater than 150 minutes a week, dietary modifications with low saturated fat, fresh vegetables, lean meats and increased fiber/fish oil supplementation. - Is advised if she needs refills on Lipitor, and if she wants to restart fenofibrate for her elevated triglycerides, or she would rather take fish oil and increase fiber in her diet first, and then retest in 6 months. - Either way would want her to retest in 6 months.

## 2015-08-19 MED ORDER — ATORVASTATIN CALCIUM 10 MG PO TABS: 10.0000 mg | ORAL_TABLET | Freq: Every morning | ORAL | Status: DC

## 2015-08-19 NOTE — Telephone Encounter (Signed)
Patient states that she will start fish oil and diet / exercise recommendations.  She will begin lipitor again.  She will need a refill lipitor.  [facility] [place].

## 2015-08-19 NOTE — Telephone Encounter (Signed)
LMOM for pt to CB.  

## 2015-09-28 ENCOUNTER — Other Ambulatory Visit: Payer: Self-pay | Admitting: *Deleted

## 2015-09-28 MED ORDER — FENOFIBRATE 54 MG PO TABS: 54.0000 mg | ORAL_TABLET | Freq: Every morning | ORAL | Status: DC

## 2015-12-09 ENCOUNTER — Other Ambulatory Visit: Payer: Self-pay | Admitting: *Deleted

## 2015-12-09 MED ORDER — HYDROCHLOROTHIAZIDE 25 MG PO TABS: 25.0000 mg | ORAL_TABLET | Freq: Every day | ORAL | Status: DC

## 2016-01-10 ENCOUNTER — Other Ambulatory Visit: Payer: Self-pay | Admitting: Obstetrics and Gynecology

## 2016-01-10 DIAGNOSIS — N644 Mastodynia: Secondary | ICD-10-CM

## 2016-01-17 ENCOUNTER — Encounter: Payer: Self-pay | Admitting: Family Medicine

## 2016-01-17 ENCOUNTER — Ambulatory Visit (INDEPENDENT_AMBULATORY_CARE_PROVIDER_SITE_OTHER): Payer: BLUE CROSS/BLUE SHIELD | Admitting: Family Medicine

## 2016-01-17 ENCOUNTER — Telehealth: Payer: Self-pay | Admitting: Family Medicine

## 2016-01-17 VITALS — BP 129/77 | HR 68 | Temp 98.0°F | Resp 20 | Ht 64.0 in | Wt 165.8 lb

## 2016-01-17 DIAGNOSIS — I10 Essential (primary) hypertension: Secondary | ICD-10-CM | POA: Diagnosis not present

## 2016-01-17 DIAGNOSIS — E785 Hyperlipidemia, unspecified: Secondary | ICD-10-CM

## 2016-01-17 LAB — LIPID PANEL
Cholesterol: 171 mg/dL (ref 0–200)
HDL: 49.5 mg/dL (ref >39.00)
LDL CALC: 100 mg/dL — AB (ref 0–99)
NonHDL: 121.82
TRIGLYCERIDES: 108 mg/dL (ref 0.0–149.0)
Total CHOL/HDL Ratio: 3
VLDL: 21.6 mg/dL (ref 0.0–40.0)

## 2016-01-17 MED ORDER — ATORVASTATIN CALCIUM 10 MG PO TABS: 10.0000 mg | ORAL_TABLET | Freq: Every morning | ORAL | Status: DC

## 2016-01-17 MED ORDER — FENOFIBRATE 54 MG PO TABS: 54.0000 mg | ORAL_TABLET | Freq: Every morning | ORAL | Status: DC

## 2016-01-17 NOTE — Patient Instructions (Signed)

## 2016-01-17 NOTE — Telephone Encounter (Signed)
Please call pt: - her cholesterol levels look wonderful.  - refills on medications called in.

## 2016-01-17 NOTE — Progress Notes (Signed)
Patient ID: Brianna Conrad, female   DOB: November 25, 1955, 60 y.o.   MRN: [id number]   Subjective:    Patient ID: Brianna Conrad, female    DOB: [birth year], 60 y.o.   MRN: [id number]  HPI   Hyperlipidemia LDL goal <100: Patient restarted Lipitor 10 mg and fenofibrate, and tolerating. Attempting to watch diet more closely and exercising a little bit with yard work.FH stroke.   Essential hypertension, benign Patient brings her BP at home and all with in ranges of normal. She is starting to exercise more now that it is summer. She watches her sodium intake. She denies dizziness, LE, CP or shortness of breath.   Past Medical History  Diagnosis Date  . History of squamous cell carcinoma excision     RIGHT SHOULDER  . Atypical glandular cells on Pap smear   . History of colon polyps     BENIGN  2013  . Diverticulosis   . Vaginal delivery [id number], 1993  . Cancer ([place]) 2014    basal cell skin cancer  . History of chicken pox   . Hypertension   . Hyperlipidemia    Allergies  Allergen Reactions  . Amoxicillin Rash  . Penicillins Rash   Past Surgical History  Procedure Laterality Date  . Colonoscopy w/ polypectomy  09/2011  . Tubal ligation  1993    POST PARTUM  . Hysteroscopy w/d&c N/A 06/20/2013    Procedure: DILATATION AND CURETTAGE /HYSTEROSCOPY;  Surgeon: [name], MD;  Location: [facility];  Service: Gynecology;  Laterality: N/A;  . Cervical conization w/bx N/A 06/20/2013    Procedure: CONIZATION CERVIX WITH BIOPSY;  Surgeon: [name], MD;  Location: [facility];  Service: Gynecology;  Laterality: N/A;   Family History  Problem Relation Age of Onset  . Diabetes Father   . Colon cancer Brother 64  . Diabetes Paternal Uncle   . Diabetes Paternal Grandmother   . Stroke Paternal Grandfather    Social History   Social History  . Marital Status: Married    Spouse Name: N/A  . Number of Children: N/A  . Years of Education: N/A   Occupational History  . Not on file.    Social History Main Topics  . Smoking status: Never Smoker   . Smokeless tobacco: Never Used  . Alcohol Use: Yes     Comment: RARE  . Drug Use: No  . Sexual Activity: Yes    Birth Control/ Protection: Surgical     Comment: Tubal ligation   Other Topics Concern  . Not on file   Social History Narrative   Married, Brianna Conrad. 3 children.   Retired Film/video editor). College degree.    Drinks caffeinated beverages.   Wears her seatbelt, exercises at least 3 times a week, smoke detectors in the home.   No firearms in the home.   Feels safe in her relationships.     Medication List       This list is accurate as of: 01/17/16  8:19 AM.  Always use your most recent med list.               atorvastatin 10 MG tablet  Commonly known as:  LIPITOR  Take 1 tablet (10 mg total) by mouth every morning. Reported on 08/11/2015     fenofibrate 54 MG tablet  Take 1 tablet (54 mg total) by mouth every morning. Reported on 08/11/2015     hydrochlorothiazide 25 MG tablet  Commonly known as:  HYDRODIURIL  Take 1 tablet (  25 mg total) by mouth daily.         Review of Systems Negative, with the exception of above mentioned in HPI     Objective:   Physical Exam BP 129/77 mmHg  Pulse 68  Temp(Src) 98 F (36.7 C)  Resp 20  Ht 5\' 4"  (1.626 m)  Wt 165 lb 12.8 oz (75.206 kg)  BMI 28.45 kg/m2  SpO2 98% Gen: Afebrile. No acute distress. Nontoxic in appearance, well-developed, well-nourished, Caucasian female, pleasant. HENT: AT. [place]. Bilateral TM visualized and normal in appearance. MMM, no oral lesions.  Eyes:Pupils Equal Round Reactive to light, Extraocular movements intact,  Conjunctiva without redness, discharge or icterus. Neck/lymp/endocrine: Supple, no lymphadenopathy, no thyromegaly CV: RRR no murmur appreciated, no edema Chest: CTAB, no wheeze or crackles.  Abd: Soft. flAt. NTND. BS present. No Masses palpated. Skin: No rashes, purpura or petechiae.  Neuro:  Normal gait. PERLA. EOMi.  Alert. Oriented x3  Psych: Normal affect, dress and demeanor. Normal speech. Normal thought content and judgment    Assessment & Plan:  Yanai Rutkoski is a 60 y.o. female present for establishment of care and annual physical. 1. Essential hypertension, benign - Stable today.  - Continue HCTZ 25 mg daily, low-salt diet, continue exercising greater than 150 minutes a week. - F/U 6 mos  2. Hyperlipidemia LDL goal <100 - Lipitor and fenofibrate restarted last visit. Tolerating well.  - FH stroke. - Lipids collected today fasting. Will refill medications dependent on results in the event she needs higher dose.    F/u 6 mos  Electronically Signed by: Howard Pouch, DO [facility]

## 2016-01-17 NOTE — Telephone Encounter (Signed)
Left message with lab results on patient voice mail 

## 2016-01-19 ENCOUNTER — Ambulatory Visit
Admission: RE | Admit: 2016-01-19 | Discharge: 2016-01-19 | Disposition: A | Discharge status: Home / Self Care | Payer: BLUE CROSS/BLUE SHIELD | Source: Ambulatory Visit | Attending: Obstetrics and Gynecology | Admitting: Obstetrics and Gynecology

## 2016-01-19 DIAGNOSIS — N644 Mastodynia: Secondary | ICD-10-CM

## 2016-04-17 ENCOUNTER — Other Ambulatory Visit: Payer: Self-pay | Admitting: *Deleted

## 2016-04-17 MED ORDER — HYDROCHLOROTHIAZIDE 25 MG PO TABS: 25.0000 mg | ORAL_TABLET | Freq: Every day | ORAL | 3 refills | Status: DC

## 2016-04-17 NOTE — Telephone Encounter (Signed)
HCTZ refilled per refill protocol

## 2016-08-08 ENCOUNTER — Telehealth: Payer: Self-pay | Admitting: Family Medicine

## 2016-08-08 DIAGNOSIS — Z1321 Encounter for screening for nutritional disorder: Secondary | ICD-10-CM

## 2016-08-08 DIAGNOSIS — I10 Essential (primary) hypertension: Secondary | ICD-10-CM

## 2016-08-08 DIAGNOSIS — Z131 Encounter for screening for diabetes mellitus: Secondary | ICD-10-CM

## 2016-08-08 DIAGNOSIS — Z1329 Encounter for screening for other suspected endocrine disorder: Secondary | ICD-10-CM

## 2016-08-08 DIAGNOSIS — Z130 Encounter for screening for diseases of the blood and blood-forming organs and certain disorders involving the immune mechanism: Secondary | ICD-10-CM

## 2016-08-08 NOTE — Telephone Encounter (Signed)
Future orders placed for CPE

## 2016-08-11 ENCOUNTER — Other Ambulatory Visit (INDEPENDENT_AMBULATORY_CARE_PROVIDER_SITE_OTHER): Payer: BLUE CROSS/BLUE SHIELD

## 2016-08-11 DIAGNOSIS — Z1321 Encounter for screening for nutritional disorder: Secondary | ICD-10-CM

## 2016-08-11 DIAGNOSIS — Z131 Encounter for screening for diabetes mellitus: Secondary | ICD-10-CM | POA: Diagnosis not present

## 2016-08-11 DIAGNOSIS — Z130 Encounter for screening for diseases of the blood and blood-forming organs and certain disorders involving the immune mechanism: Secondary | ICD-10-CM | POA: Diagnosis not present

## 2016-08-11 DIAGNOSIS — I10 Essential (primary) hypertension: Secondary | ICD-10-CM

## 2016-08-11 DIAGNOSIS — Z1329 Encounter for screening for other suspected endocrine disorder: Secondary | ICD-10-CM | POA: Diagnosis not present

## 2016-08-11 LAB — COMPREHENSIVE METABOLIC PANEL
ALK PHOS: 54 U/L (ref 39–117)
ALT: 34 U/L (ref 0–35)
AST: 23 U/L (ref 0–37)
Albumin: 4.5 g/dL (ref 3.5–5.2)
BILIRUBIN TOTAL: 1.1 mg/dL (ref 0.2–1.2)
BUN: 19 mg/dL (ref 6–23)
CALCIUM: 9.5 mg/dL (ref 8.4–10.5)
CO2: 32 mEq/L (ref 19–32)
Chloride: 100 mEq/L (ref 96–112)
Creatinine, Ser: 0.81 mg/dL (ref 0.40–1.20)
GFR: 76.83 mL/min (ref >60.00)
GLUCOSE: 107 mg/dL — AB (ref 70–99)
POTASSIUM: 3.8 meq/L (ref 3.5–5.1)
Sodium: 139 mEq/L (ref 135–145)
TOTAL PROTEIN: 6.9 g/dL (ref 6.0–8.3)

## 2016-08-11 LAB — CBC WITH DIFFERENTIAL/PLATELET
BASOS ABS: 0 10*3/uL (ref 0.0–0.1)
Basophils Relative: 0.2 % (ref 0.0–3.0)
EOS PCT: 1 % (ref 0.0–5.0)
Eosinophils Absolute: 0.1 10*3/uL (ref 0.0–0.7)
HCT: 43 % (ref 36.0–46.0)
Hemoglobin: 14.8 g/dL (ref 12.0–15.0)
LYMPHS ABS: 3.5 10*3/uL (ref 0.7–4.0)
Lymphocytes Relative: 41.9 % (ref 12.0–46.0)
MCHC: 34.4 g/dL (ref 30.0–36.0)
MCV: 91.3 fl (ref 78.0–100.0)
MONOS PCT: 5.1 % (ref 3.0–12.0)
Monocytes Absolute: 0.4 10*3/uL (ref 0.1–1.0)
NEUTROS ABS: 4.4 10*3/uL (ref 1.4–7.7)
NEUTROS PCT: 51.8 % (ref 43.0–77.0)
Platelets: 221 10*3/uL (ref 150.0–400.0)
RBC: 4.71 Mil/uL (ref 3.87–5.11)
RDW: 12.7 % (ref 11.5–15.5)
WBC: 8.5 10*3/uL (ref 4.0–10.5)

## 2016-08-11 LAB — VITAMIN D 25 HYDROXY (VIT D DEFICIENCY, FRACTURES): VITD: 30.72 ng/mL (ref 30.00–100.00)

## 2016-08-11 LAB — TSH: TSH: 2.5 u[IU]/mL (ref 0.35–4.50)

## 2016-08-11 LAB — HEMOGLOBIN A1C: HEMOGLOBIN A1C: 5.5 % (ref 4.6–6.5)

## 2016-08-12 ENCOUNTER — Other Ambulatory Visit: Payer: Self-pay | Admitting: Family Medicine

## 2016-08-14 ENCOUNTER — Other Ambulatory Visit: Payer: Self-pay | Admitting: Family Medicine

## 2016-08-14 MED ORDER — ATORVASTATIN CALCIUM 10 MG PO TABS
10.0000 mg | ORAL_TABLET | Freq: Every morning | ORAL | 0 refills | Status: DC
Start: 2016-08-14 — End: 2016-09-16

## 2016-08-14 MED ORDER — FENOFIBRATE 54 MG PO TABS: 54.0000 mg | ORAL_TABLET | Freq: Every morning | ORAL | 0 refills | Status: DC

## 2016-08-14 NOTE — Telephone Encounter (Signed)
[facility]  Please advise.

## 2016-08-16 ENCOUNTER — Encounter: Payer: Self-pay | Admitting: Family Medicine

## 2016-08-16 ENCOUNTER — Ambulatory Visit (INDEPENDENT_AMBULATORY_CARE_PROVIDER_SITE_OTHER): Payer: BLUE CROSS/BLUE SHIELD | Admitting: Family Medicine

## 2016-08-16 VITALS — BP 169/90 | HR 76 | Temp 97.9°F | Resp 20 | Ht 64.0 in | Wt 171.8 lb

## 2016-08-16 DIAGNOSIS — Z0000 Encounter for general adult medical examination without abnormal findings: Secondary | ICD-10-CM | POA: Diagnosis not present

## 2016-08-16 DIAGNOSIS — Z6829 Body mass index (BMI) 29.0-29.9, adult: Secondary | ICD-10-CM

## 2016-08-16 DIAGNOSIS — W57XXXD Bitten or stung by nonvenomous insect and other nonvenomous arthropods, subsequent encounter: Secondary | ICD-10-CM

## 2016-08-16 DIAGNOSIS — Z1159 Encounter for screening for other viral diseases: Secondary | ICD-10-CM | POA: Diagnosis not present

## 2016-08-16 DIAGNOSIS — M25522 Pain in left elbow: Secondary | ICD-10-CM

## 2016-08-16 DIAGNOSIS — R0989 Other specified symptoms and signs involving the circulatory and respiratory systems: Secondary | ICD-10-CM | POA: Diagnosis not present

## 2016-08-16 DIAGNOSIS — Z114 Encounter for screening for human immunodeficiency virus [HIV]: Secondary | ICD-10-CM | POA: Diagnosis not present

## 2016-08-16 DIAGNOSIS — E785 Hyperlipidemia, unspecified: Secondary | ICD-10-CM

## 2016-08-16 DIAGNOSIS — I10 Essential (primary) hypertension: Secondary | ICD-10-CM

## 2016-08-16 LAB — HIV ANTIBODY (ROUTINE TESTING W REFLEX): HIV: NONREACTIVE

## 2016-08-16 NOTE — Progress Notes (Signed)
Subjective:    Patient ID: Brianna Conrad, female    DOB: 1956-07-19, 61 y.o.   MRN: [id number]  HPI  Patient Care Team    Relationship Specialty Notifications Start End  Ma Hillock, DO PCP - General Family Medicine  07/14/15   Milus Banister, MD Attending Physician Gastroenterology  08/16/16   [name], MD Consulting Physician Obstetrics and Gynecology  08/16/16     Patient presents for CPE.  All past medical history, surgical history, allergies, family history, immunizations and social history was obtained from the patient today and entered into the electronic medical record. All medical records will be updated at that time.  Tick bite: Patient reports having a tick bite sometime back in June, location was on her right medial upper thigh. She states she did not receive treatment for this tick bite. She then experienced a rash along her beltline, that helped resolved. Again she was not seen for the rash. She now has noticed left elbow pain and swelling and other arthritic pain, and is wondering if this could be sequela from the tick bite in June.  Health maintenance:  Colonoscopy: fhx brother 31, personal history of colon polyps. Last colonoscopy 2013, told to return in 5 years. Due in 2018. Dr. [name].  Mammogram: No fhx, no abnl mammogram (has cyst). Last mammogram June 2017 with rpt in 6 months. Cyst in right breast being monitored.  Cervical cancer screening: Last Pap smear 2016. Records indicate ASCUS, negative HPV in the past. Patient follows with gynecology. Dr. [name].  Immunizations: Patient states she does not believe in a flu shot, declined. Zostavax age 30, indicated. Infectious disease screening: HIV and hepatitis C obtained today, patient agreeable. Patient has dental home. No admissions.   Depression screen [facility] 2/9 08/16/2016 08/11/2015  Decreased Interest 0 0  Down, Depressed, Hopeless 0 0  PHQ - 2 Score 0 0   Current Exercise Habits: The patient does not  participate in regular exercise at present Exercise limited by: None identified   Past Medical History:  Diagnosis Date  . Atypical glandular cells on Pap smear   . Cancer ([place]) 2014   basal cell skin cancer  . Diverticulosis   . History of chicken pox   . History of colon polyps    BENIGN  2013  . History of squamous cell carcinoma excision    RIGHT SHOULDER  . Hyperlipidemia   . Hypertension   . Vaginal delivery [id number], 1993   Allergies  Allergen Reactions  . Amoxicillin Rash  . Penicillins Rash   Past Surgical History:  Procedure Laterality Date  . CERVICAL CONIZATION W/BX N/A 06/20/2013   Procedure: CONIZATION CERVIX WITH BIOPSY;  Surgeon: [name], MD;  Location: [facility];  Service: Gynecology;  Laterality: N/A;  . COLONOSCOPY W/ POLYPECTOMY  09/2011  . HYSTEROSCOPY W/D&C N/A 06/20/2013   Procedure: DILATATION AND CURETTAGE /HYSTEROSCOPY;  Surgeon: [name], MD;  Location: [facility];  Service: Gynecology;  Laterality: N/A;  . TUBAL LIGATION  1993   POST PARTUM   Family History  Problem Relation Age of Onset  . Diabetes Father   . Colon cancer Brother 64  . Diabetes Paternal Uncle   . Diabetes Paternal Grandmother   . Stroke Paternal Grandfather    Social History   Social History  . Marital status: Married    Spouse name: N/A  . Number of children: N/A  . Years of education: N/A   Occupational History  . Not on file.  Social History Main Topics  . Smoking status: Never Smoker  . Smokeless tobacco: Never Used  . Alcohol use Yes     Comment: RARE  . Drug use: No  . Sexual activity: Yes    Birth control/ protection: Surgical     Comment: Tubal ligation   Other Topics Concern  . Not on file   Social History Narrative   Married, Brianna Conrad. 3 children.   Retired Film/video editor). College degree.    Drinks caffeinated beverages.   Wears her seatbelt, exercises at least 3 times a week, smoke detectors in the home.   No firearms in the home.    Feels safe in her relationships.     Review of Systems Negative, with the exception of above mentioned in HPI     Objective:   Physical Exam BP (!) 169/90 (BP Location: Right Arm, Patient Position: Sitting, Cuff Size: Normal)   Pulse 76   Temp 97.9 F (36.6 C)   Resp 20   Ht 5\' 4"  (1.626 m)   Wt 171 lb 12 oz (77.9 kg)   SpO2 99%   BMI 29.48 kg/m   Gen: Afebrile. No acute distress. Nontoxic in appearance, well-developed, well-nourished, female. HENT: AT. [place]. Bilateral TM visualized and normal in appearance. MMM. Bilateral nares within normal limits. Throat without erythema or exudates. No cough Eyes:Pupils Equal Round Reactive to light, Extraocular movements intact,  Conjunctiva without redness, discharge or icterus. Neck/lymp/endocrine: Supple, no lymphadenopathy, no thyromegaly CV: RRR no murmur identified, no edema, +2/4 P posterior tibialis pulses. Left carotid bruit identified. Chest: CTAB, no wheeze or crackles Abd: Soft. Round. NTND. BS present. No Masses palpated.  MSK: No erythema, mild soft tissue swelling medial left elbow. Full range of motion. Neurovascularly intact distally. Skin: No rashes, purpura or petechiae.  Neuro:  Normal gait. PERLA. EOMi. Alert. Oriented. Cranial nerves II through XII intact. Muscle strength 5/5 upper and lower extremity. DTRs equal bilaterally. Psych: Normal affect, dress and demeanor. Normal speech. Normal thought content and judgment.    Assessment & Plan:  Lacandice Goga is a 61 y.o. female present for CPE. Encounter for preventive health examination Insurance provider with AVS information on routine health maintenance. She was encouraged to exercise greater than 150 minutes a week and maintain a healthy diet. Colonoscopy: fhx brother 32, personal history of colon polyps. Last colonoscopy 2013, told to return in 5 years. Due in 2018. Dr. [name].  Mammogram: No fhx, no abnl mammogram (has cyst). Last mammogram June 2017 with rpt in 6  months being scheduled. Cyst in right breast being monitored.  Cervical cancer screening: Last Pap smear 2016. Records indicate ASCUS, negative HPV in the past. Patient follows with gynecology. Dr. [name].  Immunizations: Patient states she does not believe in a flu shot, declined. Zostavax age 33, indicated. Infectious disease screening: HIV and hepatitis C obtained today, patient agreeable.  Tick bite, subsequent encounter/Elbow pain - Discussed tickborne illnesses with patient today, will test for [facility] spotted and Lyme disease. - Lyme Aby, Western Blot IgG & IgM w/bands - [place] spotted fvr abs pnl(IgG+IgM) - NSAIDs advised for discomfort  Encounter for screening for HIV - HIV antibody (with reflex) Need for hepatitis C screening test - Hepatitis C antibody  Left carotid bruit - New. Pt endorses occasional dizziness/presyncope sx on occasions.  - US Carotid Duplex Bilateral; Future  Essential hypertension, benign - Elevated today. Patient reports normal blood pressures at home. She is in pain today which may be driving  up her blood pressure.  -She is to monitor her blood pressure at home, if routinely above 140/80 been she will need to be seen sooner for her blood pressure rechecked. - Low-salt diet, increased exercise - Continue HCTZ 25 mg daily  Hyperlipidemia LDL goal <100/BMI 29 - Continue fenofibrate and Lipitor. - Stroke history in family - Exercise greater than 150 minutes a week, high fiber/low saturated fat diet.  Electronically Signed by: Howard Pouch, DO [facility] [place]

## 2016-08-16 NOTE — Patient Instructions (Addendum)
Take 2000u daily of vit d. We will call you with lab results once they are available.  You will be called to schedule [place] of neck.  Take aleve for next few days with heat for back.  Monitor BP after pain decreases, if > 140/80 routinely then We will need to see you.      Preventive Care 40-64 Years, Female Preventive care refers to lifestyle choices and visits with your health care provider that can promote health and wellness. What does preventive care include?  A yearly physical exam. This is also called an annual well check.  Dental exams once or twice a year.  Routine eye exams. Ask your health care provider how often you should have your eyes checked.  Personal lifestyle choices, including:  Daily care of your teeth and gums.  Regular physical activity.  Eating a healthy diet.  Avoiding tobacco and drug use.  Limiting alcohol use.  Practicing safe sex.  Taking low-dose aspirin daily starting at age 25.  Taking vitamin and mineral supplements as recommended by your health care provider. What happens during an annual well check? The services and screenings done by your health care provider during your annual well check will depend on your age, overall health, lifestyle risk factors, and family history of disease. Counseling  Your health care provider may ask you questions about your:  Alcohol use.  Tobacco use.  Drug use.  Emotional well-being.  Home and relationship well-being.  Sexual activity.  Eating habits.  Work and work Statistician.  Method of birth control.  Menstrual cycle.  Pregnancy history. Screening  You may have the following tests or measurements:  Height, weight, and BMI.  Blood pressure.  Lipid and cholesterol levels. These may be checked every 5 years, or more frequently if you are over 80 years old.  Skin check.  Lung cancer screening. You may have this screening every year starting at age 59 if you have a 30-pack-year  history of smoking and currently smoke or have quit within the past 15 years.  Fecal occult blood test (FOBT) of the stool. You may have this test every year starting at age 37.  Flexible sigmoidoscopy or colonoscopy. You may have a sigmoidoscopy every 5 years or a colonoscopy every 10 years starting at age 28.  Hepatitis C blood test.  Hepatitis B blood test.  Sexually transmitted disease (STD) testing.  Diabetes screening. This is done by checking your blood sugar (glucose) after you have not eaten for a while (fasting). You may have this done every 1-3 years.  Mammogram. This may be done every 1-2 years. Talk to your health care provider about when you should start having regular mammograms. This may depend on whether you have a family history of breast cancer.  BRCA-related cancer screening. This may be done if you have a family history of breast, ovarian, tubal, or peritoneal cancers.  Pelvic exam and Pap test. This may be done every 3 years starting at age 37. Starting at age 51, this may be done every 5 years if you have a Pap test in combination with an HPV test.  Bone density scan. This is done to screen for osteoporosis. You may have this scan if you are at high risk for osteoporosis. Discuss your test results, treatment options, and if necessary, the need for more tests with your health care provider. Vaccines  Your health care provider may recommend certain vaccines, such as:  Influenza vaccine. This is recommended every year.  Tetanus, diphtheria, and acellular pertussis (Tdap, Td) vaccine. You may need a Td booster every 10 years.  Varicella vaccine. You may need this if you have not been vaccinated.  Zoster vaccine. You may need this after age 36.  Measles, mumps, and rubella (MMR) vaccine. You may need at least one dose of MMR if you were born in 1957 or later. You may also need a second dose.  Pneumococcal 13-valent conjugate (PCV13) vaccine. You may need this if  you have certain conditions and were not previously vaccinated.  Pneumococcal polysaccharide (PPSV23) vaccine. You may need one or two doses if you smoke cigarettes or if you have certain conditions.  Meningococcal vaccine. You may need this if you have certain conditions.  Hepatitis A vaccine. You may need this if you have certain conditions or if you travel or work in places where you may be exposed to hepatitis A.  Hepatitis B vaccine. You may need this if you have certain conditions or if you travel or work in places where you may be exposed to hepatitis B.  Haemophilus influenzae type b (Hib) vaccine. You may need this if you have certain conditions. Talk to your health care provider about which screenings and vaccines you need and how often you need them. This information is not intended to replace advice given to you by your health care provider. Make sure you discuss any questions you have with your health care provider. Document Released: 08/20/2015 Document Revised: 04/12/2016 Document Reviewed: 05/25/2015 Elsevier Interactive Patient Education  2017 [employer].   Please help [place] help you:  It is a privilege to be able to take care of great patients such as yourself. We are honored you have chosen [facility] for your Primary Care home. Below you will find basic instructions that you may need to access in the future. Please help [place] help you by reading the instructions, which cover many of the frequent questions we experience.   Prescription refills and request:  -In order to allow more efficient response time, please call your pharmacy for all refills. They will forward the request electronically to [place]. This allows for the quickest possible response. Request left on a nurse line can take longer to refill, since these are checked as time allows between office patients and other phone calls.  - refill request can take up to 3-5 working days to complete.  - If request is sent  electronically and request is appropiate, it is usually completed in 1-2 business days.  - all patients will need to be seen routinely for all chronic medical conditions requiring prescription medications (see follow-up below). If you are overdue for follow up on your condition, you will be asked to make an appointment and we will call in enough medication to cover you until your appointment (up to 30 days).  - all controlled substances will require a face to face visit to request/refill.  - if you desire your prescriptions to go through a new pharmacy, and have an active script at original pharmacy, you will need to call your pharmacy and have scripts transferred to new pharmacy. This is completed between the pharmacy locations and not by your provider.    Results: If any images or labs were ordered, it can take up to 1 week to get results depending on the test ordered and the lab/facility running and resulting the test. - Normal or stable results, which do not need further discussion, will be released to your mychart immediately  with attached note to you. A call will not be generated for normal results. Please make certain to sign up for mychart. If you have questions on how to activate your mychart you can call the front office.  - If your results need further discussion, our office will attempt to contact you via phone, and if unable to reach you after 2 attempts, we will release your abnormal result to your mychart with instructions.  - All results will be automatically released in mychart after 1 week.  - Your provider will provide you with explanation and instruction on all relevant material in your results. Please keep in mind, results and labs may appear confusing or abnormal to the untrained eye, but it does not mean they are actually abnormal for you personally. If you have any questions about your results that are not covered, or you desire more detailed explanation than what was provided, you  should make an appointment with your provider to do so.   Our office handles many outgoing and incoming calls daily. If we have not contacted you within 1 week about your results, please check your mychart to see if there is a message first and if not, then contact our office.  In helping with this matter, you help decrease call volume, and therefore allow [place] to be able to respond to patients needs more efficiently.   Acute office visits (sick visit):  An acute visit is intended for a new problem and are scheduled in shorter time slots to allow schedule openings for patients with new problems. This is the appropriate visit to discuss a new problem. In order to provide you with excellent quality medical care with proper time for you to explain your problem, have an exam and receive treatment with instructions, these appointments should be limited to one new problem per visit. If you experience a new problem, in which you desire to be addressed, please make an acute office visit, we save openings on the schedule to accommodate you. Please do not save your new problem for any other type of visit, let us take care of it properly and quickly for you.   Follow up visits:  Depending on your condition(s) your provider will need to see you routinely in order to provide you with quality care and prescribe medication(s). Most chronic conditions (Example: hypertension, Diabetes, depression/anxiety... etc), require visits a couple times a year. Your provider will instruct you on proper follow up for your personal medical conditions and history. Please make certain to make follow up appointments for your condition as instructed. Failing to do so could result in lapse in your medication treatment/refills. If you request a refill, and are overdue to be seen on a condition, we will always provide you with a 30 day script (once) to allow you time to schedule.    Medicare wellness (well visit): - we have a wonderful Nurse  Maudie Mercury), that will meet with you and provide you will yearly medicare wellness visits. These visits should occur yearly (can not be scheduled less than 1 calendar year apart) and cover preventive health, immunizations, advance directives and screenings you are entitled to yearly through your medicare benefits. Do not miss out on your entitled benefits, this is when medicare will pay for these benefits to be ordered for you.  These are strongly encouraged by your provider and is the appropriate type of visit to make certain you are up to date with all preventive health benefits. If you have not had  your medicare wellness exam in the last 12 months, please make certain to schedule one by calling the office and schedule your medicare wellness with Maudie Mercury as soon as possible.   Yearly physical (well visit):  - Adults are recommended to be seen yearly for physicals. Check with your insurance and date of your last physical, most insurances require one calendar year between physicals. Physicals include all preventive health topics, screenings, medical exam and labs that are appropriate for gender/age and history. You may have fasting labs needed at this visit. This is a well visit (not a sick visit), acute topics should not be covered during this visit.  - Pediatric patients are seen more frequently when they are younger. Your provider will advise you on well child visit timing that is appropriate for your their age. - This is not a medicare wellness visit. Medicare wellness exams do not have an exam portion to the visit. Some medicare companies allow for a physical, some do not allow a yearly physical. If your medicare allows a yearly physical you can schedule the medicare wellness with our nurse Maudie Mercury and have your physical with your provider after, on the same day. Please check with insurance for your full benefits.   Late Policy/No Shows:  - all new patients should arrive 15-30 minutes earlier than appointment to  allow [place] time  to  obtain all personal demographics,  insurance information and for you to complete office paperwork. - All established patients should arrive 10-15 minutes earlier than appointment time to update all information and be checked in .  - In our best efforts to run on time, if you are late for your appointment you will be asked to either reschedule or if able, we will work you back into the schedule. There will be a wait time to work you back in the schedule,  depending on availability.  - If you are unable to make it to your appointment as scheduled, please call 24 hours ahead of time to allow [place] to fill the time slot with someone else who needs to be seen. If you do not cancel your appointment ahead of time, you may be charged a no show fee.

## 2016-08-17 ENCOUNTER — Telehealth: Payer: Self-pay | Admitting: Family Medicine

## 2016-08-17 ENCOUNTER — Ambulatory Visit (HOSPITAL_BASED_OUTPATIENT_CLINIC_OR_DEPARTMENT_OTHER)
Admission: RE | Admit: 2016-08-17 | Discharge: 2016-08-17 | Disposition: A | Discharge status: Home / Self Care | Payer: BLUE CROSS/BLUE SHIELD | Source: Ambulatory Visit | Attending: Family Medicine | Admitting: Family Medicine

## 2016-08-17 DIAGNOSIS — E663 Overweight: Secondary | ICD-10-CM | POA: Insufficient documentation

## 2016-08-17 DIAGNOSIS — I6523 Occlusion and stenosis of bilateral carotid arteries: Secondary | ICD-10-CM | POA: Diagnosis not present

## 2016-08-17 DIAGNOSIS — W57XXXA Bitten or stung by nonvenomous insect and other nonvenomous arthropods, initial encounter: Secondary | ICD-10-CM | POA: Insufficient documentation

## 2016-08-17 DIAGNOSIS — M25522 Pain in left elbow: Secondary | ICD-10-CM | POA: Insufficient documentation

## 2016-08-17 DIAGNOSIS — R0989 Other specified symptoms and signs involving the circulatory and respiratory systems: Secondary | ICD-10-CM

## 2016-08-17 LAB — LYME ABY, WSTRN BLT IGG & IGM W/BANDS
B BURGDORFERI IGG ABS (IB): NEGATIVE
B burgdorferi IgM Abs (IB): NEGATIVE
LYME DISEASE 23 KD IGM: NONREACTIVE
LYME DISEASE 39 KD IGG: NONREACTIVE
LYME DISEASE 39 KD IGM: NONREACTIVE
LYME DISEASE 41 KD IGG: NONREACTIVE
LYME DISEASE 58 KD IGG: NONREACTIVE
LYME DISEASE 66 KD IGG: NONREACTIVE
Lyme Disease 18 kD IgG: NONREACTIVE
Lyme Disease 23 kD IgG: NONREACTIVE
Lyme Disease 28 kD IgG: NONREACTIVE
Lyme Disease 30 kD IgG: NONREACTIVE
Lyme Disease 41 kD IgM: NONREACTIVE
Lyme Disease 45 kD IgG: NONREACTIVE
Lyme Disease 93 kD IgG: NONREACTIVE

## 2016-08-17 LAB — HEPATITIS C ANTIBODY: HCV AB: NEGATIVE

## 2016-08-17 NOTE — Telephone Encounter (Addendum)
Please call patient: Her carotid Doppler studies showed very mild plaque/cholesterol in her carotid arteries on both sides, nothing concerning, no narrowing of the arteries. - In addition her HIV and hepatitis C results are normal. - Lyme and [place] spotted fever labs are also both negative.  - joint pain is possibly arthritis type pains, if worsening would like to see her back.

## 2016-08-18 LAB — ROCKY MTN SPOTTED FVR ABS PNL(IGG+IGM)
RMSF IgG: DETECTED — AB
RMSF IgM: NOT DETECTED

## 2016-08-18 LAB — REFLEX RMSF IGG TITER: RMSF IgG Titer: 1:128 {titer} — ABNORMAL HIGH

## 2016-08-18 NOTE — Telephone Encounter (Signed)
Spoke with patient reviewed dopplar and lab results with patient . Patient verbalized understanding.

## 2016-08-21 ENCOUNTER — Telehealth: Payer: Self-pay | Admitting: Family Medicine

## 2016-08-21 ENCOUNTER — Encounter: Payer: Self-pay | Admitting: Gastroenterology

## 2016-08-21 DIAGNOSIS — A770 Spotted fever due to Rickettsia rickettsii: Secondary | ICD-10-CM | POA: Insufficient documentation

## 2016-08-21 HISTORY — DX: Spotted fever due to Rickettsia rickettsii: A77.0

## 2016-08-21 MED ORDER — DOXYCYCLINE HYCLATE 100 MG PO TABS: 100.0000 mg | ORAL_TABLET | Freq: Two times a day (BID) | ORAL | 0 refills | Status: DC

## 2016-08-21 NOTE — Telephone Encounter (Signed)
Left message on patient voicemail to return call.
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
## 2016-08-22 NOTE — Telephone Encounter (Signed)
Spoke with patient reviewed information and instructions patient verbalized understanding. 

## 2016-09-06 DIAGNOSIS — L218 Other seborrheic dermatitis: Secondary | ICD-10-CM | POA: Diagnosis not present

## 2016-09-06 DIAGNOSIS — L820 Inflamed seborrheic keratosis: Secondary | ICD-10-CM | POA: Diagnosis not present

## 2016-09-06 DIAGNOSIS — L570 Actinic keratosis: Secondary | ICD-10-CM | POA: Diagnosis not present

## 2016-09-09 ENCOUNTER — Other Ambulatory Visit: Payer: Self-pay | Admitting: Family Medicine

## 2016-09-16 ENCOUNTER — Other Ambulatory Visit: Payer: Self-pay | Admitting: Family Medicine

## 2017-02-01 DIAGNOSIS — L821 Other seborrheic keratosis: Secondary | ICD-10-CM | POA: Diagnosis not present

## 2017-02-01 DIAGNOSIS — L859 Epidermal thickening, unspecified: Secondary | ICD-10-CM | POA: Diagnosis not present

## 2017-02-01 DIAGNOSIS — L814 Other melanin hyperpigmentation: Secondary | ICD-10-CM | POA: Diagnosis not present

## 2017-02-01 DIAGNOSIS — D1801 Hemangioma of skin and subcutaneous tissue: Secondary | ICD-10-CM | POA: Diagnosis not present

## 2017-02-01 DIAGNOSIS — L570 Actinic keratosis: Secondary | ICD-10-CM | POA: Diagnosis not present

## 2017-03-22 ENCOUNTER — Encounter: Payer: Self-pay | Admitting: *Deleted

## 2017-03-22 ENCOUNTER — Other Ambulatory Visit: Payer: Self-pay | Admitting: Family Medicine

## 2017-03-22 NOTE — Telephone Encounter (Signed)
HCTZ refilled for 30 day supply, Patient needs to be seen prior to anymore refills. Sent patient message in [facility] Chart.

## 2017-04-17 ENCOUNTER — Encounter: Payer: Self-pay | Admitting: Family Medicine

## 2017-04-17 ENCOUNTER — Ambulatory Visit (INDEPENDENT_AMBULATORY_CARE_PROVIDER_SITE_OTHER): Payer: BLUE CROSS/BLUE SHIELD | Admitting: Family Medicine

## 2017-04-17 VITALS — BP 138/76 | HR 67 | Temp 98.1°F | Resp 20 | Wt 167.2 lb

## 2017-04-17 DIAGNOSIS — Z6828 Body mass index (BMI) 28.0-28.9, adult: Secondary | ICD-10-CM | POA: Diagnosis not present

## 2017-04-17 DIAGNOSIS — E785 Hyperlipidemia, unspecified: Secondary | ICD-10-CM | POA: Diagnosis not present

## 2017-04-17 DIAGNOSIS — I10 Essential (primary) hypertension: Secondary | ICD-10-CM | POA: Diagnosis not present

## 2017-04-17 DIAGNOSIS — R011 Cardiac murmur, unspecified: Secondary | ICD-10-CM | POA: Diagnosis not present

## 2017-04-17 DIAGNOSIS — R0989 Other specified symptoms and signs involving the circulatory and respiratory systems: Secondary | ICD-10-CM

## 2017-04-17 MED ORDER — HYDROCHLOROTHIAZIDE 25 MG PO TABS: 25.0000 mg | ORAL_TABLET | Freq: Every day | ORAL | 1 refills | Status: DC
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
## 2017-04-17 NOTE — Patient Instructions (Signed)
Watch your BP at home at routinely over 135/85 then follow up sooner, otherwise I will see you in 6 months.   Good luck this weekend. I hope it goes well.    Please help [place] help you:  We are honored you have chosen [facility] for your Primary Care home. Below you will find basic instructions that you may need to access in the future. Please help [place] help you by reading the instructions, which cover many of the frequent questions we experience.   Prescription refills and request:  -In order to allow more efficient response time, please call your pharmacy for all refills. They will forward the request electronically to [place]. This allows for the quickest possible response. Request left on a nurse line can take longer to refill, since these are checked as time allows between office patients and other phone calls.  - refill request can take up to 3-5 working days to complete.  - If request is sent electronically and request is appropiate, it is usually completed in 1-2 business days.  - all patients will need to be seen routinely for all chronic medical conditions requiring prescription medications (see follow-up below). If you are overdue for follow up on your condition, you will be asked to make an appointment and we will call in enough medication to cover you until your appointment (up to 30 days).  - all controlled substances will require a face to face visit to request/refill.  - if you desire your prescriptions to go through a new pharmacy, and have an active script at original pharmacy, you will need to call your pharmacy and have scripts transferred to new pharmacy. This is completed between the pharmacy locations and not by your provider.    Results: If any images or labs were ordered, it can take up to 1 week to get results depending on the test ordered and the lab/facility running and resulting the test. - Normal or stable results, which do not need further discussion, may be released to  your mychart immediately with attached note to you. A call may not be generated for normal results. Please make certain to sign up for mychart. If you have questions on how to activate your mychart you can call the front office.  - If your results need further discussion, our office will attempt to contact you via phone, and if unable to reach you after 2 attempts, we will release your abnormal result to your mychart with instructions.  - All results will be automatically released in mychart after 1 week.  - Your provider will provide you with explanation and instruction on all relevant material in your results. Please keep in mind, results and labs may appear confusing or abnormal to the untrained eye, but it does not mean they are actually abnormal for you personally. If you have any questions about your results that are not covered, or you desire more detailed explanation than what was provided, you should make an appointment with your provider to do so.   Our office handles many outgoing and incoming calls daily. If we have not contacted you within 1 week about your results, please check your mychart to see if there is a message first and if not, then contact our office.  In helping with this matter, you help decrease call volume, and therefore allow [place] to be able to respond to patients needs more efficiently.   Acute office visits (sick visit):  An acute visit is intended for  a new problem and are scheduled in shorter time slots to allow schedule openings for patients with new problems. This is the appropriate visit to discuss a new problem. In order to provide you with excellent quality medical care with proper time for you to explain your problem, have an exam and receive treatment with instructions, these appointments should be limited to one new problem per visit. If you experience a new problem, in which you desire to be addressed, please make an acute office visit, we save openings on the schedule  to accommodate you. Please do not save your new problem for any other type of visit, let us take care of it properly and quickly for you.   Follow up visits:  Depending on your condition(s) your provider will need to see you routinely in order to provide you with quality care and prescribe medication(s). Most chronic conditions (Example: hypertension, Diabetes, depression/anxiety... etc), require visits a couple times a year. Your provider will instruct you on proper follow up for your personal medical conditions and history. Please make certain to make follow up appointments for your condition as instructed. Failing to do so could result in lapse in your medication treatment/refills. If you request a refill, and are overdue to be seen on a condition, we will always provide you with a 30 day script (once) to allow you time to schedule.    Medicare wellness (well visit): - we have a wonderful Nurse Maudie Mercury), that will meet with you and provide you will yearly medicare wellness visits. These visits should occur yearly (can not be scheduled less than 1 calendar year apart) and cover preventive health, immunizations, advance directives and screenings you are entitled to yearly through your medicare benefits. Do not miss out on your entitled benefits, this is when medicare will pay for these benefits to be ordered for you.  These are strongly encouraged by your provider and is the appropriate type of visit to make certain you are up to date with all preventive health benefits. If you have not had your medicare wellness exam in the last 12 months, please make certain to schedule one by calling the office and schedule your medicare wellness with Maudie Mercury as soon as possible.   Yearly physical (well visit):  - Adults are recommended to be seen yearly for physicals. Check with your insurance and date of your last physical, most insurances require one calendar year between physicals. Physicals include all preventive health  topics, screenings, medical exam and labs that are appropriate for gender/age and history. You may have fasting labs needed at this visit. This is a well visit (not a sick visit), new problems should not be covered during this visit (see acute visit).  - Pediatric patients are seen more frequently when they are younger. Your provider will advise you on well child visit timing that is appropriate for your their age. - This is not a medicare wellness visit. Medicare wellness exams do not have an exam portion to the visit. Some medicare companies allow for a physical, some do not allow a yearly physical. If your medicare allows a yearly physical you can schedule the medicare wellness with our nurse Maudie Mercury and have your physical with your provider after, on the same day. Please check with insurance for your full benefits.   Late Policy/No Shows:  - all new patients should arrive 15-30 minutes earlier than appointment to allow [place] time  to  obtain all personal demographics,  insurance information and for you to  complete office paperwork. - All established patients should arrive 10-15 minutes earlier than appointment time to update all information and be checked in .  - In our best efforts to run on time, if you are late for your appointment you will be asked to either reschedule or if able, we will work you back into the schedule. There will be a wait time to work you back in the schedule,  depending on availability.  - If you are unable to make it to your appointment as scheduled, please call 24 hours ahead of time to allow [place] to fill the time slot with someone else who needs to be seen. If you do not cancel your appointment ahead of time, you may be charged a no show fee.

## 2017-09-21 ENCOUNTER — Other Ambulatory Visit: Payer: Self-pay | Admitting: Family Medicine

## 2017-10-16 ENCOUNTER — Encounter: Payer: Self-pay | Admitting: Family Medicine

## 2017-10-16 ENCOUNTER — Ambulatory Visit: Payer: BLUE CROSS/BLUE SHIELD | Admitting: Family Medicine

## 2017-10-16 VITALS — BP 137/75 | HR 62 | Temp 98.1°F | Resp 18 | Ht 64.0 in | Wt 163.0 lb

## 2017-10-16 DIAGNOSIS — Z79899 Other long term (current) drug therapy: Secondary | ICD-10-CM | POA: Diagnosis not present

## 2017-10-16 DIAGNOSIS — E663 Overweight: Secondary | ICD-10-CM | POA: Diagnosis not present

## 2017-10-16 DIAGNOSIS — I10 Essential (primary) hypertension: Secondary | ICD-10-CM | POA: Diagnosis not present

## 2017-10-16 DIAGNOSIS — E785 Hyperlipidemia, unspecified: Secondary | ICD-10-CM | POA: Diagnosis not present

## 2017-10-16 DIAGNOSIS — R0989 Other specified symptoms and signs involving the circulatory and respiratory systems: Secondary | ICD-10-CM

## 2017-10-16 DIAGNOSIS — R011 Cardiac murmur, unspecified: Secondary | ICD-10-CM

## 2017-10-16 MED ORDER — HYDROCHLOROTHIAZIDE 50 MG PO TABS: 150.0000 mg | ORAL_TABLET | Freq: Every day | ORAL | 1 refills | Status: DC

## 2017-10-16 MED ORDER — FENOFIBRATE 54 MG PO TABS: 54.0000 mg | ORAL_TABLET | Freq: Every morning | ORAL | 3 refills | Status: DC

## 2017-10-16 MED ORDER — ATORVASTATIN CALCIUM 10 MG PO TABS: 10.0000 mg | ORAL_TABLET | Freq: Every morning | ORAL | 1 refills | Status: DC

## 2017-10-16 NOTE — Patient Instructions (Addendum)
Increase HCTZ to 50 mg a day. I have called in the increase dose.  Please schedule a lab appt  to have fasting labs completed this week.  Low sodium diet and keep up the exercise.    Followup in 6 months or preventive/physcial appt, which ever is first.

## 2017-10-16 NOTE — Progress Notes (Signed)
Brianna Conrad , 06-11-1956, 62 y.o., female MRN: [id number] Patient Care Team    Relationship Specialty Notifications Start End  Brianna Hillock, DO PCP - General Family Medicine  07/14/15   Brianna Banister, MD Attending Physician Gastroenterology  08/16/16   Brianna Pearson, MD Consulting Physician Obstetrics and Gynecology  08/16/16     Chief Complaint  Patient presents with  . Hyperlipidemia  . Hypertension     Subjective:   Hypertension/left carotid bruit/HDL/heart murmur:: Pt reports compliance with HCTZ 25 mg QD . Blood pressures ranges at home low 130-140/60s. Patient denies chest pain, shortness of breath, dizziness or lower extremity edema.   Pt does not take a daily baby ASA. Pt is  prescribed statin And fenofibrate. BMP: 08/11/2016 within normal limits CBC: 08/11/2016 within normal limits Lipid: 01/17/2016, cholesterol 171 HDL 49, LDL 100, triglycerides 108 Diet: Low-sodium Exercise: Not routinely RF: Hypertension, hyperlipidemia, family history stroke  Depression screen [facility] 2/9 10/16/2017 08/16/2016 08/11/2015  Decreased Interest - 0 0  Down, Depressed, Hopeless 0 0 0  PHQ - 2 Score 0 0 0    Allergies  Allergen Reactions  . Amoxicillin Rash  . Penicillins Rash   Social History   Tobacco Use  . Smoking status: Never Smoker  . Smokeless tobacco: Never Used  Substance Use Topics  . Alcohol use: Yes    Comment: RARE   Past Medical History:  Diagnosis Date  . Atypical glandular cells on Pap smear   . Cancer ([place]) 2014   basal cell skin cancer  . Diverticulosis   . History of chicken pox   . History of colon polyps    BENIGN  2013  . History of squamous cell carcinoma excision    RIGHT SHOULDER  . Hyperlipidemia   . Hypertension   . Vaginal delivery [id number], 1993   Past Surgical History:  Procedure Laterality Date  . CERVICAL CONIZATION W/BX N/A 06/20/2013   Procedure: CONIZATION CERVIX WITH BIOPSY;  Surgeon: Brianna Pearson, MD;  Location: [facility];  Service: Gynecology;  Laterality: N/A;  . COLONOSCOPY W/ POLYPECTOMY  09/2011  . HYSTEROSCOPY W/D&C N/A 06/20/2013   Procedure: DILATATION AND CURETTAGE /HYSTEROSCOPY;  Surgeon: Brianna Pearson, MD;  Location: [facility];  Service: Gynecology;  Laterality: N/A;  . TUBAL LIGATION  1993   POST PARTUM   Family History  Problem Relation Age of Onset  . Diabetes Father   . Colon cancer Brother 22  . Diabetes Paternal Uncle   . Diabetes Paternal Grandmother   . Stroke Paternal Grandfather    Allergies as of 10/16/2017      Reactions   Amoxicillin Rash   Penicillins Rash      Medication List        Accurate as of 10/16/17 12:47 PM. Always use your most recent med list.          atorvastatin 10 MG tablet Commonly known as:  LIPITOR Take 1 tablet (10 mg total) by mouth every morning.   fenofibrate 54 MG tablet Take 1 tablet (54 mg total) by mouth every morning. Needs office visit prior to anymore refills   hydrochlorothiazide 50 MG tablet Commonly known as:  HYDRODIURIL Take 3 tablets (150 mg total) by mouth daily. Needs office visit prior to anymore refills.       All past medical history, surgical history, allergies, family history, immunizations andmedications were updated in the EMR today and reviewed under the history and medication portions of their EMR.  ROS: Negative, with the exception of above mentioned in HPI   Objective:  BP 137/75 (BP Location: Right Arm, Patient Position: Sitting, Cuff Size: Normal)   Pulse 62   Temp 98.1 F (36.7 C)   Resp 18   Ht '5\' 4"'  (1.626 m)   Wt 163 lb (73.9 kg)   LMP 08/07/2015   SpO2 100%   BMI 27.98 kg/m  Body mass index is 27.98 kg/m. Gen: Afebrile. No acute distress. Nontoxic in appearance, well-nourished, well-developed, pleasant, overweight Caucasian female. HENT: AT. [place].  MMM. No cough. No hoarseness.  Eyes:Pupils Equal Round Reactive to light, Extraocular movements intact,  Conjunctiva without redness, discharge  or icterus. Neck/lymp/endocrine: Supple, no lymphadenopathy, no thyromegaly CV: RRR 1/6 SM, no edema, +2/4 P posterior tibialis pulses Chest: CTAB, no wheeze or crackles Abd: Soft. NTND. BS present. No Masses palpated.  Skin: no rashes, purpura or petechiae.  Neuro: Normal gait. PERLA. EOMi. Alert. Oriented x3  No exam data present No results found. No results found for this or any previous visit (from the past 24 hour(s)).  Assessment/Plan: Brianna Conrad is a 62 y.o. female present for OV for  Essential hypertension, benign Hyperlipidemia LDL goal <100 Left carotid bruit Overweight 25.0-29.9,adult Heart murmur - Patient's blood pressure is again borderline today. Discussed that has been borderline the last 2 visits. Increase HCTZ to 50 mg daily.  - Continue fenofibrate and statin.  - Encouraged baby aspirin addition. - Low-sodium diet, routine exercise encouraged. - Heart murmur present since childhood, barely heard today. - Carotid Doppler studies completed January 2018, minimal heterogeneous plaque, with no hemodynamically significant stenosis by duplex criteria and extracranial cerebrovascular circulation. - Future labs placed for patient to have completed in one week: Fasting lipid, TSH, CBC, CMP Refills provided today on Lipitor, HCTZ and fenofibrate - Follow-up in 6 months.   Reviewed expectations re: course of current medical issues.  Discussed self-management of symptoms.  Outlined signs and symptoms indicating need for more acute intervention.  Patient verbalized understanding and all questions were answered.  Patient received an After-Visit Summary.    Orders Placed This Encounter  Procedures  . CBC w/Diff  . Comp Met (CMET)  . TSH  . Lipid panel     Note is dictated utilizing voice recognition software. Although note has been proof read prior to signing, occasional typographical errors still can be missed. If any questions arise, please do not hesitate  to call for verification.   electronically signed by:  Howard Pouch, DO  [place]

## 2017-10-19 ENCOUNTER — Other Ambulatory Visit: Payer: Self-pay | Admitting: Family Medicine

## 2017-10-19 ENCOUNTER — Other Ambulatory Visit (INDEPENDENT_AMBULATORY_CARE_PROVIDER_SITE_OTHER): Payer: BLUE CROSS/BLUE SHIELD

## 2017-10-19 DIAGNOSIS — Z79899 Other long term (current) drug therapy: Secondary | ICD-10-CM

## 2017-10-19 DIAGNOSIS — E785 Hyperlipidemia, unspecified: Secondary | ICD-10-CM

## 2017-10-19 DIAGNOSIS — I10 Essential (primary) hypertension: Secondary | ICD-10-CM | POA: Diagnosis not present

## 2017-10-19 LAB — CBC WITH DIFFERENTIAL/PLATELET
BASOS ABS: 0 10*3/uL (ref 0.0–0.1)
Basophils Relative: 0.3 % (ref 0.0–3.0)
EOS ABS: 0.1 10*3/uL (ref 0.0–0.7)
Eosinophils Relative: 1.5 % (ref 0.0–5.0)
HEMATOCRIT: 39.7 % (ref 36.0–46.0)
Hemoglobin: 13.7 g/dL (ref 12.0–15.0)
LYMPHS PCT: 43.2 % (ref 12.0–46.0)
Lymphs Abs: 2.1 10*3/uL (ref 0.7–4.0)
MCHC: 34.6 g/dL (ref 30.0–36.0)
MCV: 91.6 fl (ref 78.0–100.0)
MONOS PCT: 6.8 % (ref 3.0–12.0)
Monocytes Absolute: 0.3 10*3/uL (ref 0.1–1.0)
NEUTROS ABS: 2.3 10*3/uL (ref 1.4–7.7)
Neutrophils Relative %: 48.2 % (ref 43.0–77.0)
PLATELETS: 194 10*3/uL (ref 150.0–400.0)
RBC: 4.33 Mil/uL (ref 3.87–5.11)
RDW: 12.6 % (ref 11.5–15.5)
WBC: 4.8 10*3/uL (ref 4.0–10.5)

## 2017-10-19 LAB — COMPREHENSIVE METABOLIC PANEL
ALBUMIN: 4.5 g/dL (ref 3.5–5.2)
ALK PHOS: 50 U/L (ref 39–117)
ALT: 21 U/L (ref 0–35)
AST: 21 U/L (ref 0–37)
BILIRUBIN TOTAL: 1 mg/dL (ref 0.2–1.2)
BUN: 22 mg/dL (ref 6–23)
CALCIUM: 9.8 mg/dL (ref 8.4–10.5)
CO2: 33 meq/L — AB (ref 19–32)
CREATININE: 0.8 mg/dL (ref 0.40–1.20)
Chloride: 100 mEq/L (ref 96–112)
GFR: 77.63 mL/min (ref >60.00)
Glucose, Bld: 121 mg/dL — ABNORMAL HIGH (ref 70–99)
Potassium: 3.2 mEq/L — ABNORMAL LOW (ref 3.5–5.1)
Sodium: 141 mEq/L (ref 135–145)
TOTAL PROTEIN: 6.6 g/dL (ref 6.0–8.3)

## 2017-10-19 LAB — LIPID PANEL
CHOLESTEROL: 153 mg/dL (ref 0–200)
HDL: 51.7 mg/dL (ref >39.00)
LDL Cholesterol: 87 mg/dL (ref 0–99)
NonHDL: 101.69
TRIGLYCERIDES: 71 mg/dL (ref 0.0–149.0)
Total CHOL/HDL Ratio: 3
VLDL: 14.2 mg/dL (ref 0.0–40.0)

## 2017-10-19 LAB — TSH: TSH: 1.82 u[IU]/mL (ref 0.35–4.50)

## 2017-10-19 MED ORDER — HYDROCHLOROTHIAZIDE 50 MG PO TABS: 50.0000 mg | ORAL_TABLET | Freq: Every day | ORAL | 1 refills | Status: DC

## 2017-10-19 NOTE — Telephone Encounter (Signed)
Please inform patient the following information: Her labs over all look good with the following exceptions.   - Potassium is a little low. HCTZ can cause potassium to decrease some and since we increased her HCTZ we would definitely want to start a supplement. This has been pended to send once she is made aware of results. 1st 2 days, take 40 meq, then 10 meq KDUR daily.  - her sugar was also elevated to 122 and she was fasting. This is concerning for potential to develop diabetes. I would recommend she really watch her diet (lower carb/sugar) and increase exercise. We will monitor an a1c at her next appt in 6 months.

## 2017-10-19 NOTE — Progress Notes (Signed)
Typo on HCTZ script corrected. HCTZ 50 mg QD correct dose. Pharmacy updated.

## 2017-10-22 MED ORDER — POTASSIUM CHLORIDE ER 10 MEQ PO TBCR: EXTENDED_RELEASE_TABLET | ORAL | 0 refills | Status: DC

## 2018-03-29 ENCOUNTER — Other Ambulatory Visit: Payer: Self-pay | Admitting: Obstetrics and Gynecology

## 2018-03-29 DIAGNOSIS — N6001 Solitary cyst of right breast: Secondary | ICD-10-CM

## 2018-04-04 ENCOUNTER — Ambulatory Visit
Admission: RE | Admit: 2018-04-04 | Discharge: 2018-04-04 | Disposition: A | Discharge status: Home / Self Care | Payer: BLUE CROSS/BLUE SHIELD | Source: Ambulatory Visit | Attending: Obstetrics and Gynecology | Admitting: Obstetrics and Gynecology

## 2018-04-04 DIAGNOSIS — N6001 Solitary cyst of right breast: Secondary | ICD-10-CM | POA: Diagnosis not present

## 2018-04-04 DIAGNOSIS — R922 Inconclusive mammogram: Secondary | ICD-10-CM | POA: Diagnosis not present

## 2018-04-11 ENCOUNTER — Telehealth: Payer: Self-pay | Admitting: Family Medicine

## 2018-04-11 DIAGNOSIS — I10 Essential (primary) hypertension: Secondary | ICD-10-CM

## 2018-04-11 DIAGNOSIS — E785 Hyperlipidemia, unspecified: Secondary | ICD-10-CM

## 2018-04-11 MED ORDER — HYDROCHLOROTHIAZIDE 50 MG PO TABS: 50.0000 mg | ORAL_TABLET | Freq: Every day | ORAL | 1 refills | Status: DC

## 2018-04-11 NOTE — Telephone Encounter (Signed)
HCTZ refill Last Refill:10/19/17 # 90 1 RF Last OV: 10/16/17 PCP: Dr Raoul Pitch Pharmacy:[facility]

## 2018-04-11 NOTE — Telephone Encounter (Signed)
Copied from [place] [id number]. Topic: Quick Communication - See Telephone Encounter >> Apr 11, 2018  8:53 AM Marja Kays F wrote: Pt is needing a refill on hydrochlorothiazide   [facility] [place]   Best number [phone]

## 2018-04-14 ENCOUNTER — Other Ambulatory Visit: Payer: Self-pay | Admitting: Family Medicine

## 2018-04-14 DIAGNOSIS — I10 Essential (primary) hypertension: Secondary | ICD-10-CM

## 2018-04-14 DIAGNOSIS — E785 Hyperlipidemia, unspecified: Secondary | ICD-10-CM

## 2018-04-15 ENCOUNTER — Other Ambulatory Visit: Payer: Self-pay | Admitting: Family Medicine

## 2018-04-15 DIAGNOSIS — E785 Hyperlipidemia, unspecified: Secondary | ICD-10-CM

## 2018-04-15 DIAGNOSIS — I10 Essential (primary) hypertension: Secondary | ICD-10-CM

## 2018-05-13 ENCOUNTER — Ambulatory Visit: Payer: BLUE CROSS/BLUE SHIELD | Admitting: Family Medicine

## 2018-05-13 ENCOUNTER — Encounter: Payer: Self-pay | Admitting: Family Medicine

## 2018-05-13 VITALS — BP 136/79 | HR 63 | Temp 98.1°F | Resp 20 | Ht 64.0 in | Wt 169.0 lb

## 2018-05-13 DIAGNOSIS — B029 Zoster without complications: Secondary | ICD-10-CM

## 2018-05-13 DIAGNOSIS — E785 Hyperlipidemia, unspecified: Secondary | ICD-10-CM

## 2018-05-13 MED ORDER — PREDNISONE 20 MG PO TABS: ORAL_TABLET | ORAL | 0 refills | Status: DC

## 2018-05-13 MED ORDER — ATORVASTATIN CALCIUM 10 MG PO TABS: 10.0000 mg | ORAL_TABLET | Freq: Every morning | ORAL | 3 refills | Status: DC

## 2018-05-13 MED ORDER — VALACYCLOVIR HCL 1 G PO TABS: 1000.0000 mg | ORAL_TABLET | Freq: Three times a day (TID) | ORAL | 0 refills | Status: DC

## 2018-05-13 MED ORDER — HYDROXYZINE PAMOATE 25 MG PO CAPS: 25.0000 mg | ORAL_CAPSULE | Freq: Three times a day (TID) | ORAL | 0 refills | Status: DC | PRN

## 2018-05-13 NOTE — Progress Notes (Signed)
Brianna Conrad , October 30, 1955, 62 y.o., female MRN: [id number] Patient Care Team    Relationship Specialty Notifications Start End  Ma Hillock, DO PCP - General Family Medicine  07/14/15   Milus Banister, MD Attending Physician Gastroenterology  08/16/16   [name], MD Consulting Physician Obstetrics and Gynecology  08/16/16     Chief Complaint  Patient presents with  . Rash    under right breast     Subjective: Pt presents for an OV with complaints of painful rash of 4 days  duration.  Associated symptoms include pain, burning and itching started 2 days before rash started. She was unable to touch or lay on the area. She has not had a shingles shot. She reports increased stress. She denies fever, headaches or chills. She tried an antihistamine.   Hyperlipidemia: needs refills on statin. Lipids checked 10/2017. Reports compliance with statin and fenofibrate  Depression screen [facility] 2/9 05/13/2018 10/16/2017 08/16/2016 08/11/2015  Decreased Interest 0 - 0 0  Down, Depressed, Hopeless 0 0 0 0  PHQ - 2 Score 0 0 0 0    Allergies  Allergen Reactions  . Amoxicillin Rash  . Penicillins Rash   Social History   Tobacco Use  . Smoking status: Never Smoker  . Smokeless tobacco: Never Used  Substance Use Topics  . Alcohol use: Yes    Comment: RARE   Past Medical History:  Diagnosis Date  . Atypical glandular cells on Pap smear   . Cancer ([place]) 2014   basal cell skin cancer  . Diverticulosis   . History of chicken pox   . History of colon polyps    BENIGN  2013  . History of squamous cell carcinoma excision    RIGHT SHOULDER  . Hyperlipidemia   . Hypertension   . Vaginal delivery [id number], 1993   Past Surgical History:  Procedure Laterality Date  . CERVICAL CONIZATION W/BX N/A 06/20/2013   Procedure: CONIZATION CERVIX WITH BIOPSY;  Surgeon: [name], MD;  Location: [facility];  Service: Gynecology;  Laterality: N/A;  . COLONOSCOPY W/ POLYPECTOMY  09/2011  .  HYSTEROSCOPY W/D&C N/A 06/20/2013   Procedure: DILATATION AND CURETTAGE /HYSTEROSCOPY;  Surgeon: [name], MD;  Location: [facility];  Service: Gynecology;  Laterality: N/A;  . TUBAL LIGATION  1993   POST PARTUM   Family History  Problem Relation Age of Onset  . Diabetes Father   . Colon cancer Brother 33  . Diabetes Paternal Uncle   . Diabetes Paternal Grandmother   . Stroke Paternal Grandfather    Allergies as of 05/13/2018      Reactions   Amoxicillin Rash   Penicillins Rash      Medication List        Accurate as of 05/13/18 10:31 AM. Always use your most recent med list.          atorvastatin 10 MG tablet Commonly known as:  LIPITOR TAKE 1 TABLET BY MOUTH EVERY MORNING   fenofibrate 54 MG tablet Take 1 tablet (54 mg total) by mouth every morning. Needs office visit prior to anymore refills   hydrochlorothiazide 50 MG tablet Commonly known as:  HYDRODIURIL Take 1 tablet (50 mg total) by mouth daily. Needs office visit prior to anymore refills.       All past medical history, surgical history, allergies, family history, immunizations andmedications were updated in the EMR today and reviewed under the history and medication portions of their EMR.  ROS: Negative, with the exception of above mentioned in HPI   Objective:  BP 136/79 (BP Location: Right Arm, Patient Position: Sitting, Cuff Size: Large)   Pulse 63   Temp 98.1 F (36.7 C)   Resp 20   Ht 5\' 4"  (1.626 m)   Wt 169 lb (76.7 kg)   LMP 08/07/2015   SpO2 99%   BMI 29.01 kg/m  Body mass index is 29.01 kg/m. Gen: Afebrile. No acute distress. Nontoxic in appearance, well developed, well nourished.  HENT: AT. [place] MMM Eyes:Pupils Equal Round Reactive to light, Extraocular movements intact,  Conjunctiva without redness, discharge or icterus. Neck/lymp/endocrine: Supple,no lymphadenopathy Skin: red pathcy base with small vesicles under right axilla spreading to almost midline.  rashes, no purpura or  petechiae.  Neuro: Normal gait. PERLA. EOMi. Alert. Oriented x3  Psych: Normal affect, dress and demeanor. Normal speech. Normal thought content and judgment.  No exam data present No results found. No results found for this or any previous visit (from the past 24 hour(s)).  Assessment/Plan: Doyce Stonehouse is a 62 y.o. female present for OV for  Herpes zoster without complication - start prednisone, valtrex and can use vistaril qhs.  - OTC creams/patches for local comfort discussed. - AVS instructions on need for urgent care or follow up as well recommendations for shingles outbreak.  - encourage shingrix to start about 1 month or more after current outbreak resolves.  - f/u PRN   Hyperlipidemia LDL goal <100 - refills needed. Labs 10/2017 UTD.  - atorvastatin (LIPITOR) 10 MG tablet; Take 1 tablet (10 mg total) by mouth every morning.  Dispense: 90 tablet; Refill: 3   Reviewed expectations re: course of current medical issues.  Discussed self-management of symptoms.  Outlined signs and symptoms indicating need for more acute intervention.  Patient verbalized understanding and all questions were answered.  Patient received an After-Visit Summary.    No orders of the defined types were placed in this encounter.    Note is dictated utilizing voice recognition software. Although note has been proof read prior to signing, occasional typographical errors still can be missed. If any questions arise, please do not hesitate to call for verification.   electronically signed by:  Howard Pouch, DO  [place]

## 2018-05-13 NOTE — Patient Instructions (Signed)
Start meds as directed on labels today.     Shingles Shingles is an infection that causes a painful skin rash and fluid-filled blisters. Shingles is caused by the same virus that causes chickenpox. Shingles only develops in people who:  Have had chickenpox.  Have gotten the chickenpox vaccine. (This is rare.)  The first symptoms of shingles may be itching, tingling, or pain in an area on your skin. A rash will follow in a few days or weeks. The rash is usually on one side of the body in a bandlike or beltlike pattern. Over time, the rash turns into fluid-filled blisters that break open, scab over, and dry up. Medicines may:  Help you manage pain.  Help you recover more quickly.  Help to prevent long-term problems.  Follow these instructions at home: Medicines  Take medicines only as told by your doctor.  Apply an anti-itch or numbing cream to the affected area as told by your doctor. Blister and Rash Care  Take a cool bath or put cool compresses on the area of the rash or blisters as told by your doctor. This may help with pain and itching.  Keep your rash covered with a loose bandage (dressing). Wear loose-fitting clothing.  Keep your rash and blisters clean with mild soap and cool water or as told by your doctor.  Check your rash every day for signs of infection. These include redness, swelling, and pain that lasts or gets worse.  Do not pick your blisters.  Do not scratch your rash. General instructions  Rest as told by your doctor.  Keep all follow-up visits as told by your doctor. This is important.  Until your blisters scab over, your infection can cause chickenpox in people who have never had it or been vaccinated against it. To prevent this from happening, avoid touching other people or being around other people, especially: ? Babies. ? Pregnant women. ? Children who have eczema. ? Elderly people who have transplants. ? People who have chronic illnesses, such  as leukemia or AIDS. Contact a doctor if:  Your pain does not get better with medicine.  Your pain does not get better after the rash heals.  Your rash looks infected. Signs of infection include: ? Redness. ? Swelling. ? Pain that lasts or gets worse. Get help right away if:  The rash is on your face or nose.  You have pain in your face, pain around your eye area, or loss of feeling on one side of your face.  You have ear pain or you have ringing in your ear.  You have loss of taste.  Your condition gets worse. This information is not intended to replace advice given to you by your health care provider. Make sure you discuss any questions you have with your health care provider. Document Released: 01/10/2008 Document Revised: 03/19/2016 Document Reviewed: 05/05/2014 Elsevier Interactive Patient Education  [employer].

## 2018-05-28 ENCOUNTER — Other Ambulatory Visit: Payer: Self-pay | Admitting: Family Medicine

## 2018-05-28 ENCOUNTER — Telehealth: Payer: Self-pay | Admitting: Family Medicine

## 2018-05-28 MED ORDER — VALACYCLOVIR HCL 1 G PO TABS: 1000.0000 mg | ORAL_TABLET | Freq: Three times a day (TID) | ORAL | 0 refills | Status: DC

## 2018-05-28 NOTE — Telephone Encounter (Signed)
Patient was seen and treated for this on 05/13/18. Please advise

## 2018-05-28 NOTE — Telephone Encounter (Signed)
Valtrex prescribed

## 2018-05-28 NOTE — Progress Notes (Signed)
Valtrex prescribed

## 2018-05-28 NOTE — Telephone Encounter (Signed)
Copied from [place] [id number]. Topic: Quick Communication - See Telephone Encounter >> May 28, 2018 10:11 AM Gardiner Ramus wrote: CRM for notification. See Telephone encounter for: 05/28/18. Pt called an stated that she has another cluster of shingles show back up on her right side. She would like to know what she should do. Please advise

## 2018-05-28 NOTE — Telephone Encounter (Signed)
Patient notified

## 2018-06-18 DIAGNOSIS — Z6829 Body mass index (BMI) 29.0-29.9, adult: Secondary | ICD-10-CM | POA: Diagnosis not present

## 2018-06-18 DIAGNOSIS — Z01419 Encounter for gynecological examination (general) (routine) without abnormal findings: Secondary | ICD-10-CM | POA: Diagnosis not present

## 2018-07-28 IMAGING — US US CAROTID DUPLEX BILAT
2 series · 13 of 24 positions shown · non-contrast
Comparison: No prior duplex

CLINICAL DATA: 60-year-old female with a history of left-sided
bruit

EXAM:
BILATERAL CAROTID DUPLEX ULTRASOUND
TECHNIQUE: Gray scale imaging, color Doppler and duplex ultrasound were
performed of bilateral carotid and vertebral arteries in the neck.

[Series 1: us carotid duplex bilat · 0.06mm/px · 12 of 66 slices shown (1 of 2)]
[im 1/66]
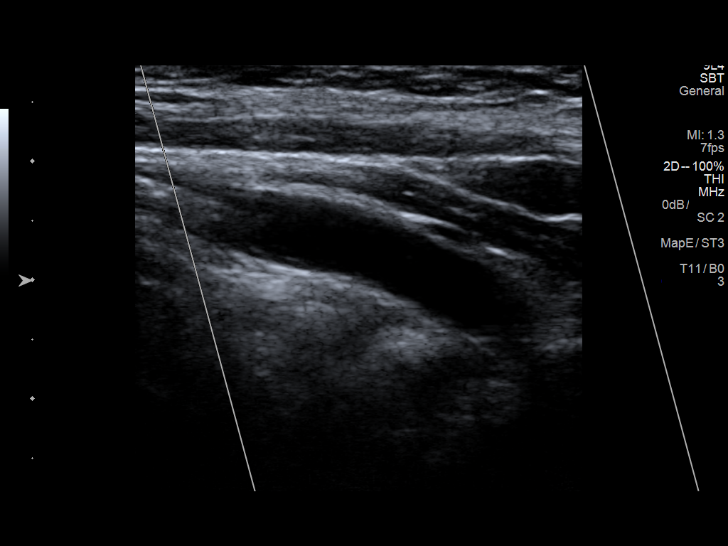
[im 6/66]
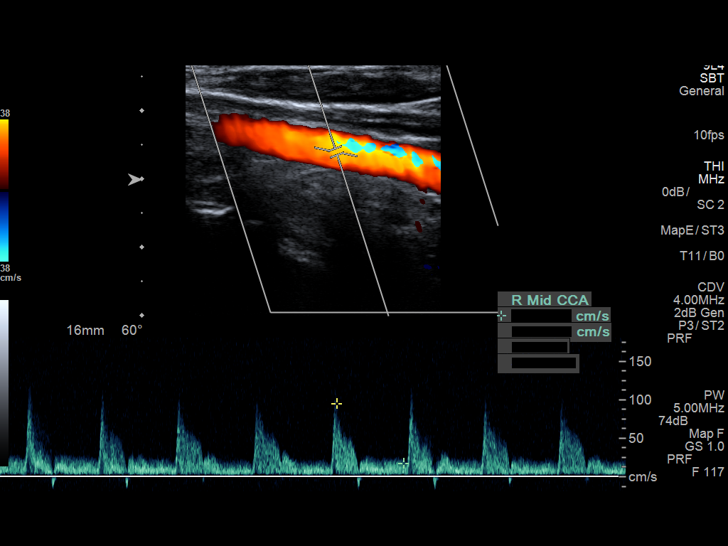
[im 12/66]
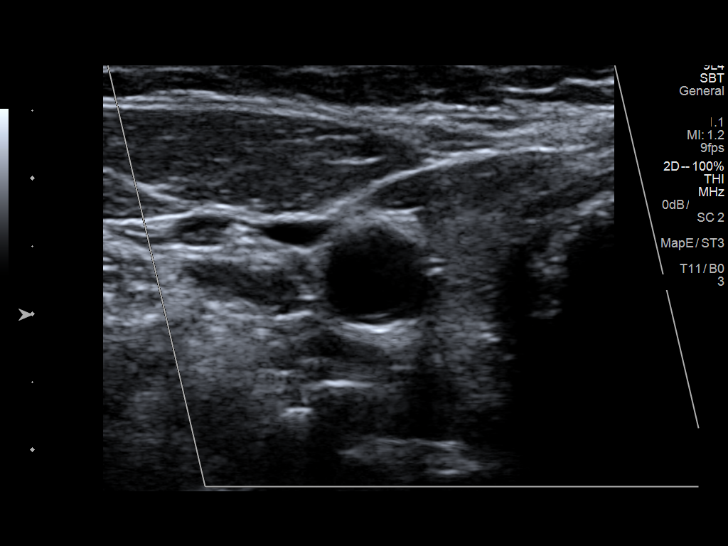
[im 18/66]
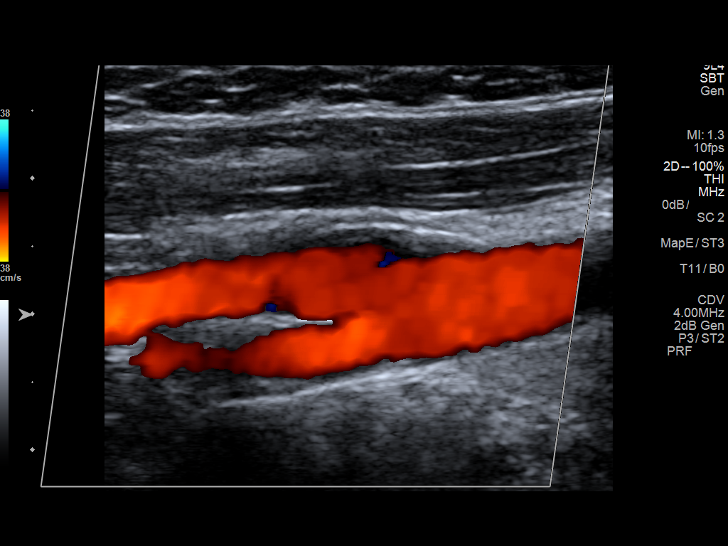
[im 24/66]
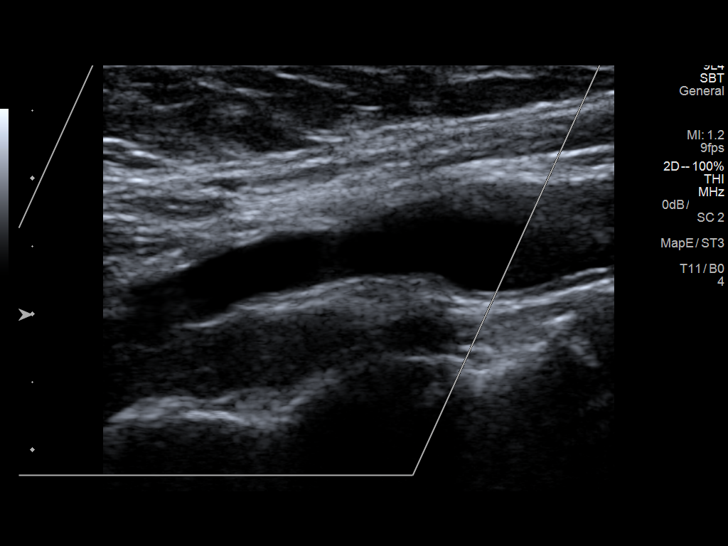
[im 30/66]
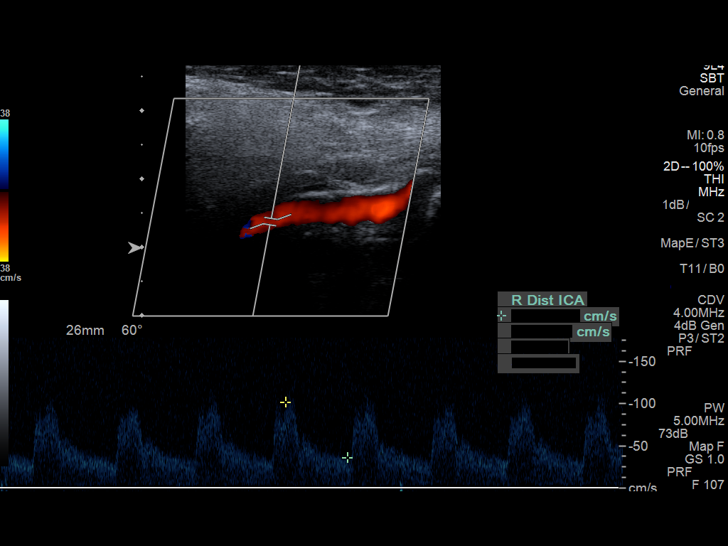
[im 36/66]
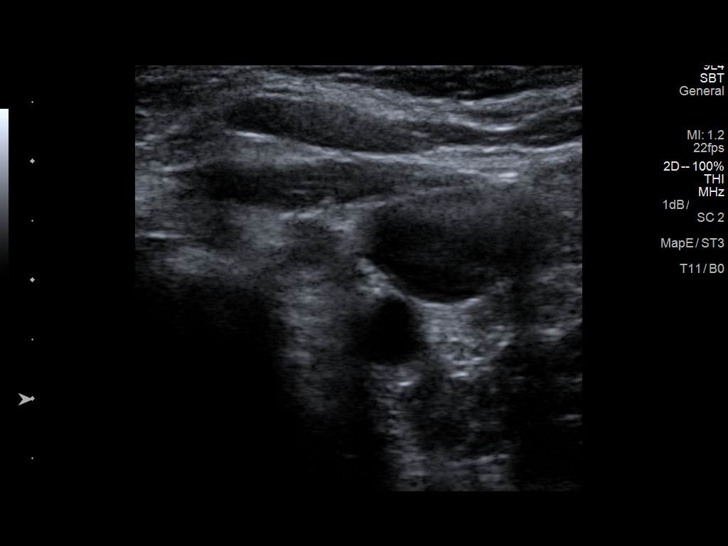
[im 39/66]
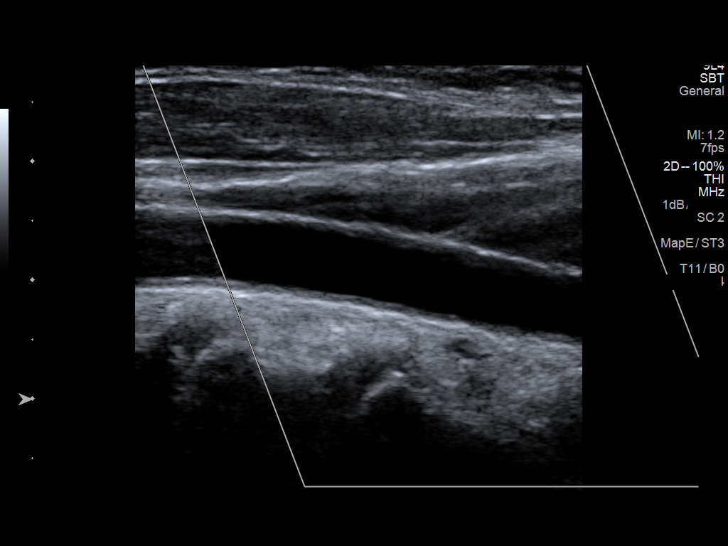
[im 45/66]
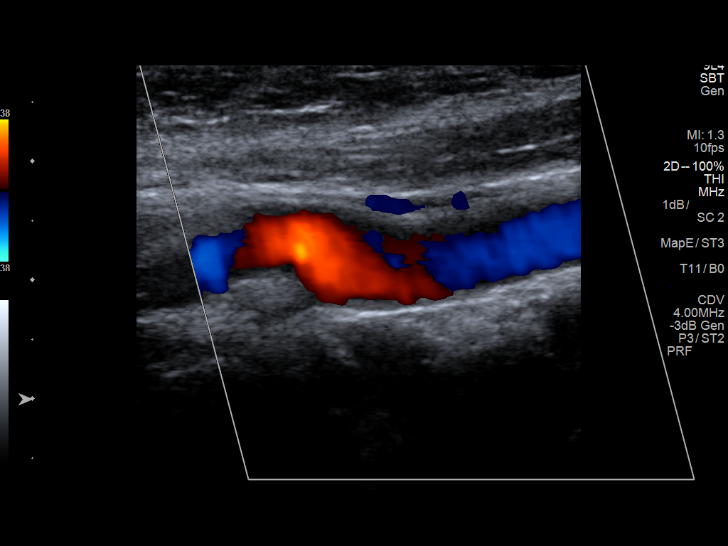
[im 51/66]
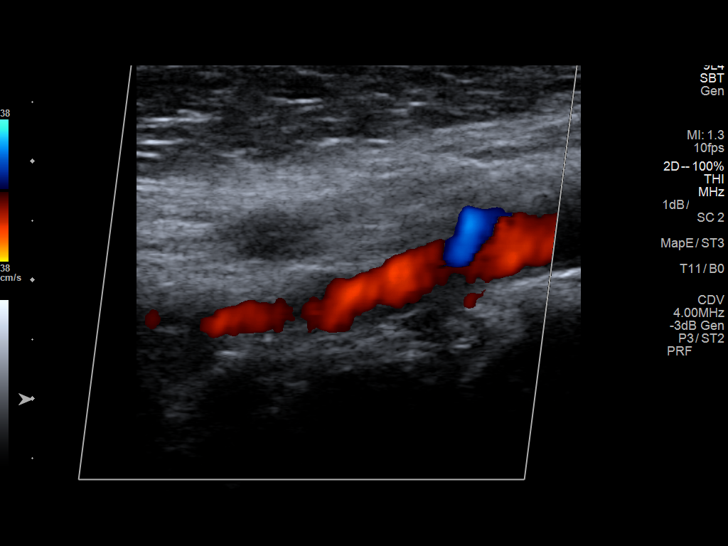
[im 57/66]
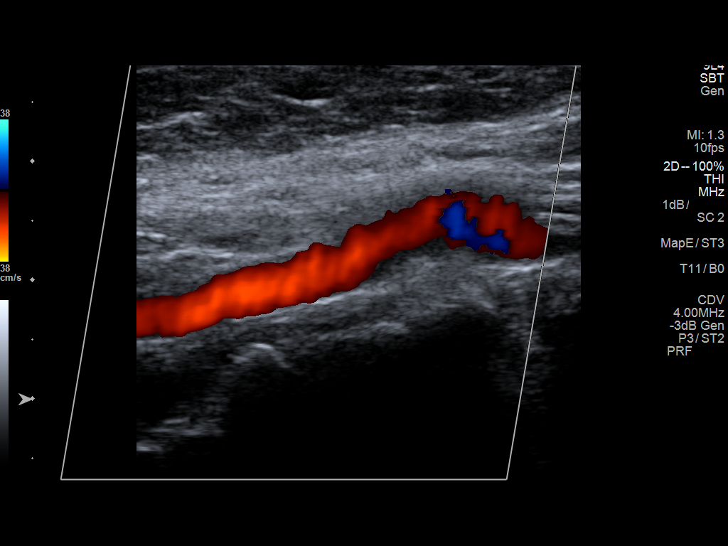
[im 63/66]
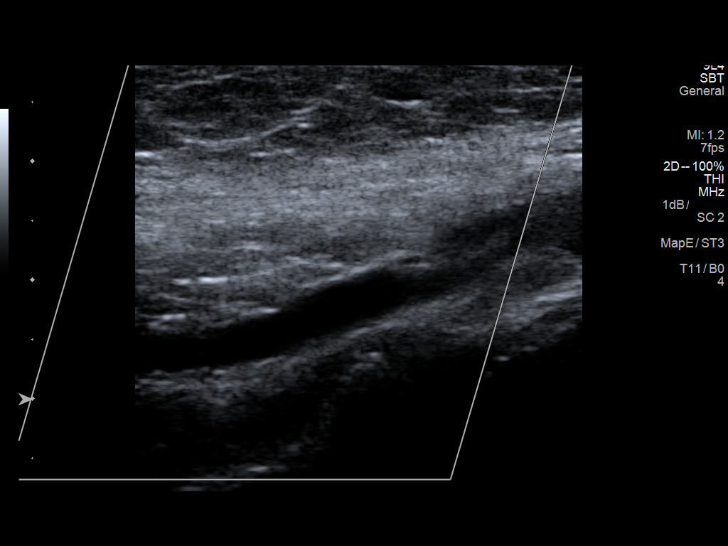

[Series 2001: us carotid duplex bilat · 1 of 1 slices shown (2 of 2)]
[im 1/1]
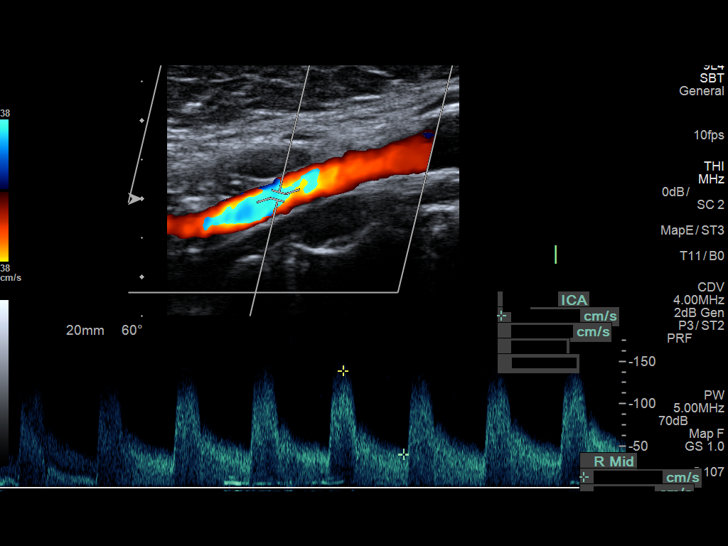

[13 of 24 positions shown; findings below may reference images not displayed]

FINDINGS: Criteria: Quantification of carotid stenosis is based on velocity
parameters that correlate the residual internal carotid diameter
with NASCET-based stenosis levels, using the diameter of the distal
internal carotid lumen as the denominator for stenosis measurement.

The following velocity measurements were obtained:

RIGHT

ICA:  Systolic 139 cm/sec, Diastolic 40 cm/sec

CCA:  140 cm/sec

SYSTOLIC ICA/CCA RATIO:

ECA:  80 cm/sec

LEFT

ICA:  Systolic 105 cm/sec, Diastolic 26 cm/sec

CCA:  127 cm/sec

SYSTOLIC ICA/CCA RATIO:

ECA:  77 cm/sec

Right Brachial SBP: 154

Left Brachial SBP: 155

RIGHT CAROTID ARTERY: No significant calcified disease of the right
common carotid artery. Intermediate waveform maintained.
Heterogeneous plaque without significant calcifications at the right
carotid bifurcation. Low resistance waveform of the right ICA. No
significant tortuosity.

RIGHT VERTEBRAL ARTERY: Antegrade flow with low resistance waveform.

LEFT CAROTID ARTERY: No significant calcified disease of the left
common carotid artery. Intermediate waveform maintained.
Heterogeneous plaque at the left carotid bifurcation without
significant calcifications. Low resistance waveform of the left ICA.

LEFT VERTEBRAL ARTERY:  Antegrade flow with low resistance waveform.
IMPRESSION: Color duplex indicates minimal heterogeneous plaque, with no
hemodynamically significant stenosis by duplex criteria in the
extracranial cerebrovascular circulation.

## 2018-10-04 ENCOUNTER — Telehealth: Payer: Self-pay

## 2018-10-04 NOTE — Telephone Encounter (Signed)
Received refill request for patients Fenofibrate. Pt was called due to needing F/U appt before getting refills on medications. Pt was scheduled per her request for 10/10/2018. Pt states she has enough of all her medications to last until that appt. Pt verbalizes understanding the need to make appt to get further refills.

## 2018-10-10 ENCOUNTER — Ambulatory Visit: Payer: BLUE CROSS/BLUE SHIELD | Admitting: Family Medicine

## 2018-10-10 ENCOUNTER — Encounter: Payer: Self-pay | Admitting: Family Medicine

## 2018-10-10 VITALS — BP 141/82 | HR 77 | Temp 97.6°F | Resp 17 | Ht 64.0 in | Wt 169.5 lb

## 2018-10-10 DIAGNOSIS — E663 Overweight: Secondary | ICD-10-CM | POA: Diagnosis not present

## 2018-10-10 DIAGNOSIS — R7301 Impaired fasting glucose: Secondary | ICD-10-CM

## 2018-10-10 DIAGNOSIS — E785 Hyperlipidemia, unspecified: Secondary | ICD-10-CM | POA: Diagnosis not present

## 2018-10-10 DIAGNOSIS — R0989 Other specified symptoms and signs involving the circulatory and respiratory systems: Secondary | ICD-10-CM | POA: Diagnosis not present

## 2018-10-10 DIAGNOSIS — R011 Cardiac murmur, unspecified: Secondary | ICD-10-CM

## 2018-10-10 DIAGNOSIS — K635 Polyp of colon: Secondary | ICD-10-CM

## 2018-10-10 DIAGNOSIS — I10 Essential (primary) hypertension: Secondary | ICD-10-CM | POA: Diagnosis not present

## 2018-10-10 LAB — HEMOGLOBIN A1C: Hgb A1c MFr Bld: 5.4 % (ref 4.6–6.5)

## 2018-10-10 LAB — LIPID PANEL
Cholesterol: 156 mg/dL (ref 0–200)
HDL: 54.3 mg/dL (ref >39.00)
LDL Cholesterol: 79 mg/dL (ref 0–99)
NONHDL: 101.39
TRIGLYCERIDES: 112 mg/dL (ref 0.0–149.0)
Total CHOL/HDL Ratio: 3
VLDL: 22.4 mg/dL (ref 0.0–40.0)

## 2018-10-10 LAB — MICROALBUMIN / CREATININE URINE RATIO
CREATININE, U: 104.6 mg/dL
Microalb Creat Ratio: 0.9 mg/g (ref 0.0–30.0)
Microalb, Ur: 0.9 mg/dL (ref 0.0–1.9)

## 2018-10-10 LAB — CBC
HEMATOCRIT: 40.5 % (ref 36.0–46.0)
Hemoglobin: 14.2 g/dL (ref 12.0–15.0)
MCHC: 35.1 g/dL (ref 30.0–36.0)
MCV: 90.3 fl (ref 78.0–100.0)
Platelets: 204 10*3/uL (ref 150.0–400.0)
RBC: 4.48 Mil/uL (ref 3.87–5.11)
RDW: 12.5 % (ref 11.5–15.5)
WBC: 5.1 10*3/uL (ref 4.0–10.5)

## 2018-10-10 LAB — COMPREHENSIVE METABOLIC PANEL
ALBUMIN: 4.7 g/dL (ref 3.5–5.2)
ALT: 26 U/L (ref 0–35)
AST: 17 U/L (ref 0–37)
Alkaline Phosphatase: 57 U/L (ref 39–117)
BUN: 28 mg/dL — AB (ref 6–23)
CHLORIDE: 102 meq/L (ref 96–112)
CO2: 30 mEq/L (ref 19–32)
Calcium: 9.7 mg/dL (ref 8.4–10.5)
Creatinine, Ser: 0.86 mg/dL (ref 0.40–1.20)
GFR: 66.97 mL/min (ref >60.00)
Glucose, Bld: 104 mg/dL — ABNORMAL HIGH (ref 70–99)
POTASSIUM: 3.4 meq/L — AB (ref 3.5–5.1)
SODIUM: 141 meq/L (ref 135–145)
Total Bilirubin: 1 mg/dL (ref 0.2–1.2)
Total Protein: 6.8 g/dL (ref 6.0–8.3)

## 2018-10-10 LAB — TSH: TSH: 1.47 u[IU]/mL (ref 0.35–4.50)

## 2018-10-10 MED ORDER — ATORVASTATIN CALCIUM 10 MG PO TABS: 10.0000 mg | ORAL_TABLET | Freq: Every morning | ORAL | 3 refills | Status: DC

## 2018-10-10 MED ORDER — CICLOPIROX OLAMINE 0.77 % EX CREA: TOPICAL_CREAM | Freq: Two times a day (BID) | CUTANEOUS | 0 refills | Status: DC

## 2018-10-10 MED ORDER — FENOFIBRATE 54 MG PO TABS: 54.0000 mg | ORAL_TABLET | Freq: Every morning | ORAL | 3 refills | Status: DC

## 2018-10-10 NOTE — Progress Notes (Signed)
Brianna Conrad , [birth year], 63 y.o., female MRN: [id number] Patient Care Team    Relationship Specialty Notifications Start End  Ma Hillock, DO PCP - General Family Medicine  07/14/15   Milus Banister, MD Attending Physician Gastroenterology  08/16/16   [name], MD Consulting Physician Obstetrics and Gynecology  08/16/16     Chief Complaint  Patient presents with  . Hypertension    Not fasting. Pt had Pap 06/2018, Dr Orvan Seen   . Recurrent Skin Infections    Pt believes she has ringworm in right armpit area x1 week. Using athlete foot spray daily on it.      Subjective:   Hypertension/left carotid bruit/HDL/heart murmur:: Pt reports compliance with HCTZ 50 mg QD . Blood pressures ranges at home are mostly around 327 systolic and 62Y diastolic. Patient denies chest pain, shortness of breath, dizziness or lower extremity edema. Not fasting today- labs collected  Pt does not take a daily baby ASA. Pt is  prescribed statin And fenofibrate. BMP: 10/2017 within normal limits CBC: 10/2017 within normal limits Lipid: 10/2017, cholesterol 153 HDL 51, LDL 87, triglycerides 71 Diet: Low-sodium Exercise: Not routinely RF: Hypertension, hyperlipidemia, family history stroke  Skin infections: Patient reports having a ringworm-like infection underneath her right axilla region.  Has been present for about a week.  He has had a similar presentation in the past.  She is using an antifungal cream and spray which is helped some, but is not completely removing her symptoms.  Depression screen [facility] 2/9 05/13/2018 10/16/2017 08/16/2016 08/11/2015  Decreased Interest 0 - 0 0  Down, Depressed, Hopeless 0 0 0 0  PHQ - 2 Score 0 0 0 0    Allergies  Allergen Reactions  . Amoxicillin Rash  . Penicillins Rash   Social History   Tobacco Use  . Smoking status: Never Smoker  . Smokeless tobacco: Never Used  Substance Use Topics  . Alcohol use: Yes    Comment: RARE   Past Medical History:    Diagnosis Date  . Atypical glandular cells on Pap smear   . Cancer ([place]) 2014   basal cell skin cancer  . Diverticulosis   . History of chicken pox   . History of colon polyps    BENIGN  2013  . History of squamous cell carcinoma excision    RIGHT SHOULDER  . Hyperlipidemia   . Hypertension   . Vaginal delivery [id number], 1993   Past Surgical History:  Procedure Laterality Date  . CERVICAL CONIZATION W/BX N/A 06/20/2013   Procedure: CONIZATION CERVIX WITH BIOPSY;  Surgeon: [name], MD;  Location: [facility];  Service: Gynecology;  Laterality: N/A;  . COLONOSCOPY W/ POLYPECTOMY  09/2011  . HYSTEROSCOPY W/D&C N/A 06/20/2013   Procedure: DILATATION AND CURETTAGE /HYSTEROSCOPY;  Surgeon: [name], MD;  Location: [facility];  Service: Gynecology;  Laterality: N/A;  . TUBAL LIGATION  1993   POST PARTUM   Family History  Problem Relation Age of Onset  . Diabetes Father   . Colon cancer Brother 47  . Diabetes Paternal Uncle   . Diabetes Paternal Grandmother   . Stroke Paternal Grandfather    Allergies as of 10/10/2018      Reactions   Amoxicillin Rash   Penicillins Rash      Medication List       Accurate as of October 10, 2018  9:06 AM. Always use your most recent med list.        atorvastatin 10  MG tablet Commonly known as:  LIPITOR Take 1 tablet (10 mg total) by mouth every morning.   fenofibrate 54 MG tablet Take 1 tablet (54 mg total) by mouth every morning. Needs office visit prior to anymore refills   hydrochlorothiazide 50 MG tablet Commonly known as:  HYDRODIURIL Take 1 tablet (50 mg total) by mouth daily. Needs office visit prior to anymore refills.       All past medical history, surgical history, allergies, family history, immunizations andmedications were updated in the EMR today and reviewed under the history and medication portions of their EMR.     ROS: Negative, with the exception of above mentioned in HPI   Objective:  BP (!) 141/82 (BP  Location: Left Arm, Patient Position: Sitting, Cuff Size: Normal)   Pulse 77   Temp 97.6 F (36.4 C) (Oral)   Resp 17   Ht _0  (1.626 m)   Wt 169 lb 8 oz (76.9 kg)   LMP 08/07/2015   SpO2 98%   BMI 29.09 kg/m  Body mass index is 29.09 kg/m. Gen: Afebrile. No acute distress. Nontoxic, pleasant female.  HENT: AT. New Home.  MMM.  Eyes:Pupils Equal Round Reactive to light, Extraocular movements intact,  Conjunctiva without redness, discharge or icterus. Neck/lymp/endocrine: Supple, no lymphadenopathy, no thyromegaly CV: RRR 1/6 SM, no edema, +2/4 P posterior tibialis pulses Chest: CTAB, no wheeze or crackles Abd: Soft. NTND. BS presetn. no Masses palpated.  Skin: Area of erythema under right axilla, with erythemic border.,No  purpura or petechiae.  Neuro:  Normal gait. PERLA. EOMi. Alert. Oriented x3. Psych: Normal affect, dress and demeanor. Normal speech. Normal thought content and judgment.  No exam data present No results found. No results found for this or any previous visit (from the past 24 hour(s)).  Assessment/Plan: [name] is a 63 y.o. female present for OV for  Essential hypertension, benign Hyperlipidemia LDL goal <100 Left carotid bruit Overweight 25.0-29.9,adult Heart murmur - BP above goal-discussed options with her today and will await her electrolyte/CMP panel.  Consider adding losartan 25 mg daily and decreasing HCTZ to 25 mg. - Continue fenofibrate and statin.  - Encouraged baby aspirin - Low-sodium diet, routine exercise encouraged. - Heart murmur present since childhood - Carotid Doppler studies completed January 2018, minimal heterogeneous plaque, with no hemodynamically significant stenosis by duplex criteria and extracranial cerebrovascular circulation. -CBC, CMP, TSH, lipid, microalbumin collected today - Follow-up in 6 months.  Elevated fasting glucose: - last visit elevated fastin glucose 121.  - a1c collected today.  colon Polyp/colon cancer  screening: -Patient is overdue for her 5-year follow-up that was due in 2018.  She would like to have her next colonoscopy done at [facility] in [place].  Referral placed for her today.   Reviewed expectations re: course of current medical issues.  Discussed self-management of symptoms.  Outlined signs and symptoms indicating need for more acute intervention.  Patient verbalized understanding and all questions were answered.  Patient received an After-Visit Summary.    Orders Placed This Encounter  Procedures  . CBC  . Comp Met (CMET)  . TSH  . Lipid panel  . HgB A1c  . Urine Microalbumin w/creat. ratio  . Ambulatory referral to Gastroenterology     Note is dictated utilizing voice recognition software. Although note has been proof read prior to signing, occasional typographical errors still can be missed. If any questions arise, please do not hesitate to call for verification.   electronically signed by:  Howard Pouch, DO  [facility] - OR

## 2018-10-10 NOTE — Patient Instructions (Signed)
We will call with lab results. At that time discuss BP regimen.  Called in the cream for your arm.  [facility] should be calling you to establish.  Please help [place] help you:  We are honored you have chosen [facility] for your [facility] home. Below you will find basic instructions that you may need to access in the future. Please help [place] help you by reading the instructions, which cover many of the frequent questions we experience.   Prescription refills and request:  -In order to allow more efficient response time, please call your pharmacy for all refills. They will forward the request electronically to [place]. This allows for the quickest possible response. Request left on a nurse line can take longer to refill, since these are checked as time allows between office patients and other phone calls.  - refill request can take up to 3-5 working days to complete.  - If request is sent electronically and request is appropiate, it is usually completed in 1-2 business days.  - all patients will need to be seen routinely for all chronic medical conditions requiring prescription medications (see follow-up below). If you are overdue for follow up on your condition, you will be asked to make an appointment and we will call in enough medication to cover you until your appointment (up to 30 days).  - all controlled substances will require a face to face visit to request/refill.  - if you desire your prescriptions to go through a new pharmacy, and have an active script at original pharmacy, you will need to call your pharmacy and have scripts transferred to new pharmacy. This is completed between the pharmacy locations and not by your provider.    Results: If any images or labs were ordered, it can take up to 1 week to get results depending on the test ordered and the lab/facility running and resulting the test. - Normal or stable results, which do not need further discussion, may be  released to your mychart immediately with attached note to you. A call may not be generated for normal results. Please make certain to sign up for mychart. If you have questions on how to activate your mychart you can call the front office.  - If your results need further discussion, our office will attempt to contact you via phone, and if unable to reach you after 2 attempts, we will release your abnormal result to your mychart with instructions.  - All results will be automatically released in mychart after 1 week.  - Your provider will provide you with explanation and instruction on all relevant material in your results. Please keep in mind, results and labs may appear confusing or abnormal to the untrained eye, but it does not mean they are actually abnormal for you personally. If you have any questions about your results that are not covered, or you desire more detailed explanation than what was provided, you should make an appointment with your provider to do so.   Our office handles many outgoing and incoming calls daily. If we have not contacted you within 1 week about your results, please check your mychart to see if there is a message first and if not, then contact our office.  In helping with this matter, you help decrease call volume, and therefore allow [place] to be able to respond to patients needs more efficiently.   Acute office visits (sick visit):  An acute visit is intended for a new problem and  are scheduled in shorter time slots to allow schedule openings for patients with new problems. This is the appropriate visit to discuss a new problem. Problems will not be addressed by phone call or Echart message. Appointment is needed if requesting treatment. In order to provide you with excellent quality medical care with proper time for you to explain your problem, have an exam and receive treatment with instructions, these appointments should be limited to one new problem per visit. If you  experience a new problem, in which you desire to be addressed, please make an acute office visit, we save openings on the schedule to accommodate you. Please do not save your new problem for any other type of visit, let us take care of it properly and quickly for you.   Follow up visits:  Depending on your condition(s) your provider will need to see you routinely in order to provide you with quality care and prescribe medication(s). Most chronic conditions (Example: hypertension, Diabetes, depression/anxiety... etc), require visits a couple times a year. Your provider will instruct you on proper follow up for your personal medical conditions and history. Please make certain to make follow up appointments for your condition as instructed. Failing to do so could result in lapse in your medication treatment/refills. If you request a refill, and are overdue to be seen on a condition, we will always provide you with a 30 day script (once) to allow you time to schedule.    Medicare wellness (well visit): - we have a wonderful Nurse Maudie Mercury), that will meet with you and provide you will yearly medicare wellness visits. These visits should occur yearly (can not be scheduled less than 1 calendar year apart) and cover preventive health, immunizations, advance directives and screenings you are entitled to yearly through your medicare benefits. Do not miss out on your entitled benefits, this is when medicare will pay for these benefits to be ordered for you.  These are strongly encouraged by your provider and is the appropriate type of visit to make certain you are up to date with all preventive health benefits. If you have not had your medicare wellness exam in the last 12 months, please make certain to schedule one by calling the office and schedule your medicare wellness with Maudie Mercury as soon as possible.   Yearly physical (well visit):  - Adults are recommended to be seen yearly for physicals. Check with your insurance  and date of your last physical, most insurances require one calendar year between physicals. Physicals include all preventive health topics, screenings, medical exam and labs that are appropriate for gender/age and history. You may have fasting labs needed at this visit. This is a well visit (not a sick visit), new problems should not be covered during this visit (see acute visit).  - Pediatric patients are seen more frequently when they are younger. Your provider will advise you on well child visit timing that is appropriate for your their age. - This is not a medicare wellness visit. Medicare wellness exams do not have an exam portion to the visit. Some medicare companies allow for a physical, some do not allow a yearly physical. If your medicare allows a yearly physical you can schedule the medicare wellness with our nurse Maudie Mercury and have your physical with your provider after, on the same day. Please check with insurance for your full benefits.   Late Policy/No Shows:  - all new patients should arrive 15-30 minutes earlier than appointment to allow [place] time  to  obtain all personal demographics,  insurance information and for you to complete office paperwork. - All established patients should arrive 10-15 minutes earlier than appointment time to update all information and be checked in .  - In our best efforts to run on time, if you are late for your appointment you will be asked to either reschedule or if able, we will work you back into the schedule. There will be a wait time to work you back in the schedule,  depending on availability.  - If you are unable to make it to your appointment as scheduled, please call 24 hours ahead of time to allow [place] to fill the time slot with someone else who needs to be seen. If you do not cancel your appointment ahead of time, you may be charged a no show fee.

## 2018-10-11 ENCOUNTER — Telehealth: Payer: Self-pay | Admitting: Family Medicine

## 2018-10-11 MED ORDER — LOSARTAN POTASSIUM 25 MG PO TABS: 25.0000 mg | ORAL_TABLET | Freq: Every day | ORAL | 1 refills | Status: DC

## 2018-10-11 MED ORDER — HYDROCHLOROTHIAZIDE 25 MG PO TABS: 25.0000 mg | ORAL_TABLET | Freq: Every day | ORAL | 1 refills | Status: DC

## 2018-10-11 NOTE — Telephone Encounter (Signed)
Copied from [facility] [id number]. Topic: General - Other >> Oct 10, 2018  4:50 PM Mcneil, Ja-Kwan wrote: Reason for CRM: Pt stated the pharmacy did not receive Rx for ring worm medication.   Called and spoke with pharmacy and they did receive the request they just had to order the cream and it would not be here until this afternoon. Pt was told by pharmacy that she could call around and see if another pharmacy has the medication. Pt was called and notified and verbalized understanding

## 2018-10-11 NOTE — Telephone Encounter (Signed)
Please inform patient the following information: All her labs look excellent.  She did have some electrolytes that were mildly off from the higher dose HCTZ (her potassium was just mildly low). Therefore, I did change her BP med as we discussed to give her more coverage. Start losartan 25 mg Qd and decreased her HCTZ back to 25 mg a day. These meds were called in today- the rest of her meds were called in yesterday.  F/U 6 mos. Sooner if BP is not < 135/85 routinely with new regimen.

## 2019-03-31 ENCOUNTER — Other Ambulatory Visit: Payer: Self-pay

## 2019-03-31 MED ORDER — LOSARTAN POTASSIUM 25 MG PO TABS: 25.0000 mg | ORAL_TABLET | Freq: Every day | ORAL | 0 refills | Status: DC

## 2019-03-31 NOTE — Progress Notes (Signed)
Received faxed refill request from [facility] in [place]. Sent a 30 day supply until patient is able to make appointment.

## 2019-04-04 ENCOUNTER — Other Ambulatory Visit: Payer: Self-pay

## 2019-04-04 MED ORDER — HYDROCHLOROTHIAZIDE 25 MG PO TABS: 25.0000 mg | ORAL_TABLET | Freq: Every day | ORAL | 0 refills | Status: DC

## 2019-05-23 ENCOUNTER — Other Ambulatory Visit: Payer: Self-pay | Admitting: Obstetrics and Gynecology

## 2019-05-23 DIAGNOSIS — Z1231 Encounter for screening mammogram for malignant neoplasm of breast: Secondary | ICD-10-CM

## 2019-05-26 ENCOUNTER — Other Ambulatory Visit: Payer: Self-pay

## 2019-05-26 ENCOUNTER — Ambulatory Visit: Payer: BLUE CROSS/BLUE SHIELD | Admitting: Family Medicine

## 2019-05-26 ENCOUNTER — Encounter: Payer: Self-pay | Admitting: Family Medicine

## 2019-05-26 VITALS — BP 145/79 | HR 65 | Temp 97.6°F | Resp 17 | Ht 64.0 in | Wt 167.1 lb

## 2019-05-26 DIAGNOSIS — Z23 Encounter for immunization: Secondary | ICD-10-CM | POA: Diagnosis not present

## 2019-05-26 DIAGNOSIS — I10 Essential (primary) hypertension: Secondary | ICD-10-CM

## 2019-05-26 DIAGNOSIS — M5442 Lumbago with sciatica, left side: Secondary | ICD-10-CM

## 2019-05-26 DIAGNOSIS — E663 Overweight: Secondary | ICD-10-CM | POA: Diagnosis not present

## 2019-05-26 DIAGNOSIS — M25511 Pain in right shoulder: Secondary | ICD-10-CM | POA: Insufficient documentation

## 2019-05-26 DIAGNOSIS — E785 Hyperlipidemia, unspecified: Secondary | ICD-10-CM | POA: Diagnosis not present

## 2019-05-26 DIAGNOSIS — G8929 Other chronic pain: Secondary | ICD-10-CM | POA: Insufficient documentation

## 2019-05-26 MED ORDER — LOSARTAN POTASSIUM 25 MG PO TABS: 25.0000 mg | ORAL_TABLET | Freq: Every day | ORAL | 1 refills | Status: DC

## 2019-05-26 MED ORDER — MELOXICAM 15 MG PO TABS: 15.0000 mg | ORAL_TABLET | Freq: Every day | ORAL | 1 refills | Status: DC

## 2019-05-26 MED ORDER — HYDROCHLOROTHIAZIDE 25 MG PO TABS: 25.0000 mg | ORAL_TABLET | Freq: Every day | ORAL | 1 refills | Status: DC

## 2019-05-26 MED ORDER — HYDROCHLOROTHIAZIDE 25 MG PO TABS: 25.0000 mg | ORAL_TABLET | Freq: Every day | ORAL | 0 refills | Status: DC

## 2019-05-26 MED ORDER — ATORVASTATIN CALCIUM 10 MG PO TABS: 10.0000 mg | ORAL_TABLET | Freq: Every morning | ORAL | 1 refills | Status: DC

## 2019-05-26 MED ORDER — FENOFIBRATE 54 MG PO TABS: 54.0000 mg | ORAL_TABLET | Freq: Every morning | ORAL | 1 refills | Status: DC

## 2019-05-26 MED ORDER — LOSARTAN POTASSIUM 25 MG PO TABS: 25.0000 mg | ORAL_TABLET | Freq: Every day | ORAL | 0 refills | Status: DC

## 2019-05-26 NOTE — Patient Instructions (Addendum)
Nice to see you today.  Follow up in 6 months for chronic conditions and annual physical.   Perform the stretches for lower back/sciatica and take naproxen (aleve) 500 mg every 12 hours when needed.   Rotator cuff- we have referred you to ortho. Keep range of motion exercises. Mobic once a day with food.    Shoulder Impingement Syndrome  Shoulder impingement syndrome is a condition that causes pain when connective tissues (tendons) surrounding the shoulder joint become pinched. These tendons are part of the group of muscles and tissues that help to stabilize the shoulder (rotator cuff). Beneath the rotator cuff is a fluid-filled sac (bursa) that allows the muscles and tendons to glide smoothly. The bursa may become swollen or irritated (bursitis). Bursitis, swelling in the rotator cuff tendons, or both conditions can decrease how much space is under a bone in the shoulder joint (acromion), resulting in impingement. What are the causes? Shoulder impingement syndrome may be caused by bursitis or swelling of the rotator cuff tendons, which may result from:  Repetitive overhead arm movements.  Falling onto the shoulder.  Weakness in the shoulder muscles. What increases the risk? You may be more likely to develop this condition if you:  Play sports that involve throwing, such as baseball.  Participate in sports such as tennis, volleyball, and swimming.  Work as a Curator, Games developer, or Architect. Some people are also more likely to develop impingement syndrome because of the shape of their acromion bone. What are the signs or symptoms? The main symptom of this condition is pain on the front or side of the shoulder. The pain may:  Get worse when lifting or raising the arm.  Get worse at night.  Wake you up from sleeping.  Feel sharp when the shoulder is moved and then fade to an ache. Other symptoms may include:  Tenderness.  Stiffness.  Inability to raise the arm above  shoulder level or behind the body.  Weakness. How is this diagnosed? This condition may be diagnosed based on:  Your symptoms and medical history.  A physical exam.  Imaging tests, such as: ? X-rays. ? MRI. ? Ultrasound. How is this treated? This condition may be treated by:  Resting your shoulder and avoiding all activities that cause pain or put stress on the shoulder.  Icing your shoulder.  NSAIDs to help reduce pain and swelling.  One or more injections of medicines to numb the area and reduce inflammation.  Physical therapy.  Surgery. This may be needed if nonsurgical treatments have not helped. Surgery may involve repairing the rotator cuff, reshaping the acromion, or removing the bursa. Follow these instructions at home: Managing pain, stiffness, and swelling   If directed, put ice on the injured area. ? Put ice in a plastic bag. ? Place a towel between your skin and the bag. ? Leave the ice on for 20 minutes, 2-3 times a day. Activity  Rest and return to your normal activities as told by your health care provider. Ask your health care provider what activities are safe for you.  Do exercises as told by your health care provider. General instructions  Do not use any products that contain nicotine or tobacco, such as cigarettes, e-cigarettes, and chewing tobacco. These can delay healing. If you need help quitting, ask your health care provider.  Ask your health care provider when it is safe for you to drive.  Take over-the-counter and prescription medicines only as told by your health care provider.  Keep all follow-up visits as told by your health care provider. This is important. How is this prevented?  Give your body time to rest between periods of activity.  Be safe and responsible while being active. This will help you avoid falls.  Maintain physical fitness, including strength and flexibility. Contact a health care provider if:  Your symptoms have  not improved after 1-2 months of treatment and rest.  You cannot lift your arm away from your body. Summary  Shoulder impingement syndrome is a condition that causes pain when connective tissues (tendons) surrounding the shoulder joint become pinched.  The main symptom of this condition is pain on the front or side of the shoulder.  This condition is usually treated with rest, ice, and pain medicines as needed. This information is not intended to replace advice given to you by your health care provider. Make sure you discuss any questions you have with your health care provider. Document Released: 07/24/2005 Document Revised: 11/15/2018 Document Reviewed: 01/16/2018 Elsevier Patient Education  2020 [employer].    Please help [place] help you:  We are honored you have chosen [facility] for your Primary Care home. Below you will find basic instructions that you may need to access in the future. Please help [place] help you by reading the instructions, which cover many of the frequent questions we experience.   Prescription refills and request:  -In order to allow more efficient response time, please call your pharmacy for all refills. They will forward the request electronically to [place]. This allows for the quickest possible response. Request left on a nurse line can take longer to refill, since these are checked as time allows between office patients and other phone calls.  - refill request can take up to 3-5 working days to complete.  - If request is sent electronically and request is appropiate, it is usually completed in 1-2 business days.  - all patients will need to be seen routinely for all chronic medical conditions requiring prescription medications (see follow-up below). If you are overdue for follow up on your condition, you will be asked to make an appointment and we will call in enough medication to cover you until your appointment (up to 30 days).  - all controlled substances will  require a face to face visit to request/refill.  - if you desire your prescriptions to go through a new pharmacy, and have an active script at original pharmacy, you will need to call your pharmacy and have scripts transferred to new pharmacy. This is completed between the pharmacy locations and not by your provider.    Results: If any images or labs were ordered, it can take up to 1 week to get results depending on the test ordered and the lab/facility running and resulting the test. - Normal or stable results, which do not need further discussion, may be released to your mychart immediately with attached note to you. A call may not be generated for normal results. Please make certain to sign up for mychart. If you have questions on how to activate your mychart you can call the front office.  - If your results need further discussion, our office will attempt to contact you via phone, and if unable to reach you after 2 attempts, we will release your abnormal result to your mychart with instructions.  - All results will be automatically released in mychart after 1 week.  - Your provider will provide you with explanation and instruction on all relevant  material in your results. Please keep in mind, results and labs may appear confusing or abnormal to the untrained eye, but it does not mean they are actually abnormal for you personally. If you have any questions about your results that are not covered, or you desire more detailed explanation than what was provided, you should make an appointment with your provider to do so.   Our office handles many outgoing and incoming calls daily. If we have not contacted you within 1 week about your results, please check your mychart to see if there is a message first and if not, then contact our office.  In helping with this matter, you help decrease call volume, and therefore allow [place] to be able to respond to patients needs more efficiently.   Acute office visits  (sick visit):  An acute visit is intended for a new problem and are scheduled in shorter time slots to allow schedule openings for patients with new problems. This is the appropriate visit to discuss a new problem. Problems will not be addressed by phone call or Echart message. Appointment is needed if requesting treatment. In order to provide you with excellent quality medical care with proper time for you to explain your problem, have an exam and receive treatment with instructions, these appointments should be limited to one new problem per visit. If you experience a new problem, in which you desire to be addressed, please make an acute office visit, we save openings on the schedule to accommodate you. Please do not save your new problem for any other type of visit, let us take care of it properly and quickly for you.   Follow up visits:  Depending on your condition(s) your provider will need to see you routinely in order to provide you with quality care and prescribe medication(s). Most chronic conditions (Example: hypertension, Diabetes, depression/anxiety... etc), require visits a couple times a year. Your provider will instruct you on proper follow up for your personal medical conditions and history. Please make certain to make follow up appointments for your condition as instructed. Failing to do so could result in lapse in your medication treatment/refills. If you request a refill, and are overdue to be seen on a condition, we will always provide you with a 30 day script (once) to allow you time to schedule.    Medicare wellness (well visit): - we have a wonderful Nurse Maudie Mercury), that will meet with you and provide you will yearly medicare wellness visits. These visits should occur yearly (can not be scheduled less than 1 calendar year apart) and cover preventive health, immunizations, advance directives and screenings you are entitled to yearly through your medicare benefits. Do not miss out on your  entitled benefits, this is when medicare will pay for these benefits to be ordered for you.  These are strongly encouraged by your provider and is the appropriate type of visit to make certain you are up to date with all preventive health benefits. If you have not had your medicare wellness exam in the last 12 months, please make certain to schedule one by calling the office and schedule your medicare wellness with Maudie Mercury as soon as possible.   Yearly physical (well visit):  - Adults are recommended to be seen yearly for physicals. Check with your insurance and date of your last physical, most insurances require one calendar year between physicals. Physicals include all preventive health topics, screenings, medical exam and labs that are appropriate for gender/age and history. You may  have fasting labs needed at this visit. This is a well visit (not a sick visit), new problems should not be covered during this visit (see acute visit).  - Pediatric patients are seen more frequently when they are younger. Your provider will advise you on well child visit timing that is appropriate for your their age. - This is not a medicare wellness visit. Medicare wellness exams do not have an exam portion to the visit. Some medicare companies allow for a physical, some do not allow a yearly physical. If your medicare allows a yearly physical you can schedule the medicare wellness with our nurse Maudie Mercury and have your physical with your provider after, on the same day. Please check with insurance for your full benefits.   Late Policy/No Shows:  - all new patients should arrive 15-30 minutes earlier than appointment to allow [place] time  to  obtain all personal demographics,  insurance information and for you to complete office paperwork. - All established patients should arrive 10-15 minutes earlier than appointment time to update all information and be checked in .  - In our best efforts to run on time, if you are late for your  appointment you will be asked to either reschedule or if able, we will work you back into the schedule. There will be a wait time to work you back in the schedule,  depending on availability.  - If you are unable to make it to your appointment as scheduled, please call 24 hours ahead of time to allow [place] to fill the time slot with someone else who needs to be seen. If you do not cancel your appointment ahead of time, you may be charged a no show fee.

## 2019-05-26 NOTE — Progress Notes (Signed)
Brianna Conrad , 05-28-1956, 63 y.o., female MRN: [id number] Patient Care Team    Relationship Specialty Notifications Start End  Ma Hillock, DO PCP - General Family Medicine  07/14/15   Milus Banister, MD Attending Physician Gastroenterology  08/16/16   [name], MD Consulting Physician Obstetrics and Gynecology  08/16/16     Chief Complaint  Patient presents with  . Hypertension    Pt is taking BP at home and has log. Takes BP medications at night.      Subjective: Brianna Conrad is a 63 y.o. female present for [facility] follow up . Hypertension/left carotid bruit/HLD/heart murmur:: Pt reports compliance with HCTZ 25mg  QD and losartan 25 . Blood pressures ranges at home are mostly around 12-129/60-66. Patient denies chest pain, shortness of breath, dizziness or lower extremity edema.   Pt does not take a daily baby ASA. Pt is  prescribed statin And fenofibrate. BMP: 10/2018 within normal limits CBC: 10/2018 within normal limits Lipid: 10/2018, WNL Tsh; 10/2018 Diet: Low-sodium Exercise: Not routinely RF: Hypertension, hyperlipidemia, family history stroke  Back pain and shoulder pain: pt presents for [facility] appt with complaints of 2 msk issues.    Lower back: intermittent. Pain wraps around hip, sometimes radiates to knee or left lateral calf. Not necessarily associated with activity all the time , but has noticed flares with certain activities. No known injuries  Or surgeries in the past. H/o arthritis.    Right shoulder: started 1 month ago after throwing a ball for  Her dog. Since that time pain has been consistent. Has taken NSAIDS on occassions, but not routinely.  No prior injury to event. No surgeries. H/ arthritis. Pain with lifting arm since event.   Depression screen [facility] 2/9 05/13/2018 10/16/2017 08/16/2016 08/11/2015  Decreased Interest 0 - 0 0  Down, Depressed, Hopeless 0 0 0 0  PHQ - 2 Score 0 0 0 0    Allergies  Allergen Reactions  . Amoxicillin Rash  . Penicillins  Rash   Social History   Tobacco Use  . Smoking status: Never Smoker  . Smokeless tobacco: Never Used  Substance Use Topics  . Alcohol use: Yes    Comment: RARE   Past Medical History:  Diagnosis Date  . Atypical glandular cells on Pap smear   . Cancer ([place]) 2014   basal cell skin cancer  . Diverticulosis   . History of chicken pox   . History of colon polyps    BENIGN  2013  . History of squamous cell carcinoma excision    RIGHT SHOULDER  . Hyperlipidemia   . Hypertension   . Vaginal delivery [id number], 1993   Past Surgical History:  Procedure Laterality Date  . CERVICAL CONIZATION W/BX N/A 06/20/2013   Procedure: CONIZATION CERVIX WITH BIOPSY;  Surgeon: [name], MD;  Location: [facility];  Service: Gynecology;  Laterality: N/A;  . COLONOSCOPY W/ POLYPECTOMY  09/2011  . HYSTEROSCOPY W/D&C N/A 06/20/2013   Procedure: DILATATION AND CURETTAGE /HYSTEROSCOPY;  Surgeon: [name], MD;  Location: [facility];  Service: Gynecology;  Laterality: N/A;  . TUBAL LIGATION  1993   POST PARTUM   Family History  Problem Relation Age of Onset  . Diabetes Father   . Colon cancer Brother 30  . Diabetes Paternal Uncle   . Diabetes Paternal Grandmother   . Stroke Paternal Grandfather    Allergies as of 05/26/2019      Reactions   Amoxicillin Rash   Penicillins Rash  Medication List       Accurate as of May 26, 2019  8:37 AM. If you have any questions, ask your nurse or doctor.        atorvastatin 10 MG tablet Commonly known as: LIPITOR Take 1 tablet (10 mg total) by mouth every morning.   ciclopirox 0.77 % cream Commonly known as: LOPROX Apply topically 2 (two) times daily.   fenofibrate 54 MG tablet Take 1 tablet (54 mg total) by mouth every morning. Needs office visit prior to anymore refills   hydrochlorothiazide 25 MG tablet Commonly known as: HYDRODIURIL Take 1 tablet (25 mg total) by mouth daily.   losartan 25 MG tablet Commonly known as: Cozaar  Take 1 tablet (25 mg total) by mouth daily.       All past medical history, surgical history, allergies, family history, immunizations andmedications were updated in the EMR today and reviewed under the history and medication portions of their EMR.     ROS: Negative, with the exception of above mentioned in HPI   Objective:  BP (!) 145/79 (BP Location: Right Arm, Patient Position: Sitting, Cuff Size: Normal)   Pulse 65   Temp 97.6 F (36.4 C) (Temporal)   Resp 17   Ht 5\' 4"  (1.626 m)   Wt 167 lb 2 oz (75.8 kg)   LMP 08/07/2015   SpO2 100%   BMI 28.69 kg/m  Body mass index is 28.69 kg/m. Gen: Afebrile. No acute distress. Nontoxic, pleasant female. Mildly overweight.  HENT: AT. [place].  Eyes:Pupils Equal Round Reactive to light, Extraocular movements intact,  Conjunctiva without redness, discharge or icterus. Neck/lymp/endocrine: Supple,no lymphadenopathy, no thyromegaly CV: RRR 1/6 SM, no edema, +2/4 P posterior tibialis pulses Chest: CTAB, no wheeze or crackles MSK:     Right shoulder: no erythema, no soft tissue swelling, decrease ROM ABD right arm with pain starting at 75 degrees, unable to ABD past 90 degrees. + hawkins, empty can test and lift off. Neg o'briens.  Skin: no rashes, purpura or petechiae.  Neuro: Normal gait. PERLA. EOMi. Alert. Oriented x3  Psych: Normal affect, dress and demeanor. Normal speech. Normal thought content and judgment.   No exam data present No results found. No results found for this or any previous visit (from the past 24 hour(s)).  Assessment/Plan: Brianna Conrad is a 63 y.o. female present for OV for  Essential hypertension, benign Hyperlipidemia LDL goal <100 Left carotid bruit Overweight 25.0-29.9,adult Heart murmur - stable. Home pressures are normal by BP log. Continue losartan 25 mg daily and  HCTZ to 25 mg. Refilled today - Continue fenofibrate and statin. Refilled today - Encouraged baby aspirin, if not taking NSAIDS routinely.   - Low-sodium diet, routine exercise encouraged. - Heart murmur present since childhood - Carotid Doppler studies completed January 2018, minimal heterogeneous plaque, with no hemodynamically significant stenosis by duplex criteria and extracranial cerebrovascular circulation. - Follow-up in 6 months. (CPE)  Chronic left-sided low back pain with left-sided sciatica - low back discomfort intermittently. No pain today. Discussed sciatica vs lumbar DDD. No prior injury.  - advised NSAIDS and sciatica stretches.  - F/U if worsening or further evaluation desired.   Acute pain of right shoulder New injury.  - Exam consistent with shoulder impingement vs rotator cuff tear.  - Start mobic QD with food.  - ROM stretches as tolerated.  - Referral to ortho placed for further work up.    Influenza immunization provided today   Reviewed expectations re: course of current  medical issues.  Discussed self-management of symptoms.  Outlined signs and symptoms indicating need for more acute intervention.  Patient verbalized understanding and all questions were answered.  Patient received an After-Visit Summary.   Orders Placed This Encounter  Procedures  . Flu Vaccine QUAD 36+ mos IM  . Ambulatory referral to Orthopedic Surgery    Note is dictated utilizing voice recognition software. Although note has been proof read prior to signing, occasional typographical errors still can be missed. If any questions arise, please do not hesitate to call for verification.   electronically signed by:  Howard Pouch, DO  [place]

## 2019-06-03 DIAGNOSIS — M7501 Adhesive capsulitis of right shoulder: Secondary | ICD-10-CM | POA: Diagnosis not present

## 2019-06-03 DIAGNOSIS — M25511 Pain in right shoulder: Secondary | ICD-10-CM | POA: Diagnosis not present

## 2019-06-30 ENCOUNTER — Telehealth: Payer: Self-pay | Admitting: Family Medicine

## 2019-06-30 MED ORDER — HYDROCHLOROTHIAZIDE 25 MG PO TABS: 25.0000 mg | ORAL_TABLET | Freq: Every day | ORAL | 1 refills | Status: DC

## 2019-06-30 NOTE — Telephone Encounter (Signed)
Pt called and stated the pharmacy would only give her 30 days worth of medication of her HCTZ. Pharmacy was called and they got all the RX's besides the HCTZ refills. Sent to pharmacy. Pt aware this was taken care of and was told to call with anymore issues.

## 2019-06-30 NOTE — Telephone Encounter (Signed)
Patient's husband picked up patient's Rx hydrochlorothiazide (HYDRODIURIL) 25 MG tablet. The pharmacy only put 30 pills in the bag with 1 refill left. Patient has been receiving letters from her insurance that they really don't want her getting 30 day refills but 90 day refills instead.  Patient will need her December refill to be a 90 day supply instead of 30.

## 2019-07-11 ENCOUNTER — Other Ambulatory Visit: Payer: Self-pay

## 2019-07-11 ENCOUNTER — Ambulatory Visit
Admission: RE | Admit: 2019-07-11 | Discharge: 2019-07-11 | Disposition: A | Discharge status: Home / Self Care | Payer: BC Managed Care – PPO | Source: Ambulatory Visit | Attending: Obstetrics and Gynecology | Admitting: Obstetrics and Gynecology

## 2019-07-11 DIAGNOSIS — Z1231 Encounter for screening mammogram for malignant neoplasm of breast: Secondary | ICD-10-CM

## 2019-07-15 ENCOUNTER — Other Ambulatory Visit: Payer: Self-pay | Admitting: Obstetrics and Gynecology

## 2019-07-15 DIAGNOSIS — R928 Other abnormal and inconclusive findings on diagnostic imaging of breast: Secondary | ICD-10-CM

## 2019-07-23 ENCOUNTER — Ambulatory Visit
Admission: RE | Admit: 2019-07-23 | Discharge: 2019-07-23 | Disposition: A | Discharge status: Home / Self Care | Payer: BC Managed Care – PPO | Source: Ambulatory Visit | Attending: Obstetrics and Gynecology | Admitting: Obstetrics and Gynecology

## 2019-07-23 ENCOUNTER — Other Ambulatory Visit: Payer: Self-pay

## 2019-07-23 DIAGNOSIS — R928 Other abnormal and inconclusive findings on diagnostic imaging of breast: Secondary | ICD-10-CM

## 2019-07-23 DIAGNOSIS — R922 Inconclusive mammogram: Secondary | ICD-10-CM | POA: Diagnosis not present

## 2019-07-23 DIAGNOSIS — N6489 Other specified disorders of breast: Secondary | ICD-10-CM | POA: Diagnosis not present

## 2019-08-04 ENCOUNTER — Telehealth: Payer: Self-pay | Admitting: Family Medicine

## 2019-08-04 NOTE — Telephone Encounter (Signed)
Pharmacy was called and they stated her insurance will over cover 30 days at a time for this medication now. Pt was called and detailed message was left letting the patient know info and that she would need to contact her insurance company to ask how she could get 90 days at a time and they probably have a preferred mail order pharmacy they use.

## 2019-08-04 NOTE — Telephone Encounter (Signed)
Patient talked to nurse Wells Guiles regarding pharmacy and her meds, said Wells Guiles personally called pharmacy to get prescription corrected. Patient reports she received 30 d/s of meds,again.  Please call patient [phone].

## 2019-09-26 ENCOUNTER — Other Ambulatory Visit: Payer: Self-pay | Admitting: Family Medicine

## 2019-09-26 DIAGNOSIS — E785 Hyperlipidemia, unspecified: Secondary | ICD-10-CM

## 2019-12-09 ENCOUNTER — Other Ambulatory Visit: Payer: Self-pay

## 2019-12-09 ENCOUNTER — Encounter: Payer: Self-pay | Admitting: Family Medicine

## 2019-12-09 ENCOUNTER — Ambulatory Visit: Payer: BC Managed Care – PPO | Admitting: Family Medicine

## 2019-12-09 VITALS — BP 138/76 | Temp 97.7°F | Resp 17 | Ht 64.0 in | Wt 159.1 lb

## 2019-12-09 DIAGNOSIS — Z79899 Other long term (current) drug therapy: Secondary | ICD-10-CM

## 2019-12-09 DIAGNOSIS — I10 Essential (primary) hypertension: Secondary | ICD-10-CM | POA: Diagnosis not present

## 2019-12-09 DIAGNOSIS — Z5181 Encounter for therapeutic drug level monitoring: Secondary | ICD-10-CM | POA: Insufficient documentation

## 2019-12-09 DIAGNOSIS — E785 Hyperlipidemia, unspecified: Secondary | ICD-10-CM

## 2019-12-09 DIAGNOSIS — R011 Cardiac murmur, unspecified: Secondary | ICD-10-CM | POA: Diagnosis not present

## 2019-12-09 LAB — COMPREHENSIVE METABOLIC PANEL
ALT: 16 U/L (ref 0–35)
AST: 15 U/L (ref 0–37)
Albumin: 4.6 g/dL (ref 3.5–5.2)
Alkaline Phosphatase: 58 U/L (ref 39–117)
BUN: 18 mg/dL (ref 6–23)
CO2: 33 mEq/L — ABNORMAL HIGH (ref 19–32)
Calcium: 9.7 mg/dL (ref 8.4–10.5)
Chloride: 99 mEq/L (ref 96–112)
Creatinine, Ser: 0.83 mg/dL (ref 0.40–1.20)
GFR: 69.51 mL/min (ref >60.00)
Glucose, Bld: 109 mg/dL — ABNORMAL HIGH (ref 70–99)
Potassium: 3.8 mEq/L (ref 3.5–5.1)
Sodium: 139 mEq/L (ref 135–145)
Total Bilirubin: 1.1 mg/dL (ref 0.2–1.2)
Total Protein: 6.8 g/dL (ref 6.0–8.3)

## 2019-12-09 LAB — CBC
HCT: 41.2 % (ref 36.0–46.0)
Hemoglobin: 14.3 g/dL (ref 12.0–15.0)
MCHC: 34.6 g/dL (ref 30.0–36.0)
MCV: 92.2 fl (ref 78.0–100.0)
Platelets: 199 10*3/uL (ref 150.0–400.0)
RBC: 4.47 Mil/uL (ref 3.87–5.11)
RDW: 12.5 % (ref 11.5–15.5)
WBC: 6.2 10*3/uL (ref 4.0–10.5)

## 2019-12-09 LAB — LIPID PANEL
Cholesterol: 156 mg/dL (ref 0–200)
HDL: 43.5 mg/dL (ref >39.00)
LDL Cholesterol: 82 mg/dL (ref 0–99)
NonHDL: 112.91
Total CHOL/HDL Ratio: 4
Triglycerides: 155 mg/dL — ABNORMAL HIGH (ref 0.0–149.0)
VLDL: 31 mg/dL (ref 0.0–40.0)

## 2019-12-09 LAB — TSH: TSH: 1.89 u[IU]/mL (ref 0.35–4.50)

## 2019-12-09 MED ORDER — ATORVASTATIN CALCIUM 10 MG PO TABS: 10.0000 mg | ORAL_TABLET | Freq: Every morning | ORAL | 1 refills | Status: DC

## 2019-12-09 MED ORDER — FENOFIBRATE 54 MG PO TABS: 54.0000 mg | ORAL_TABLET | Freq: Every morning | ORAL | 1 refills | Status: DC

## 2019-12-09 MED ORDER — HYDROCHLOROTHIAZIDE 25 MG PO TABS: 25.0000 mg | ORAL_TABLET | Freq: Every day | ORAL | 1 refills | Status: DC

## 2019-12-09 MED ORDER — LOSARTAN POTASSIUM 25 MG PO TABS: 25.0000 mg | ORAL_TABLET | Freq: Every day | ORAL | 1 refills | Status: DC

## 2019-12-09 NOTE — Progress Notes (Signed)
Brianna Conrad , 27-Feb-1956, 64 y.o., female MRN: [id number] Patient Care Team    Relationship Specialty Notifications Start End  Ma Hillock, DO PCP - General Family Medicine  07/14/15   Milus Banister, MD Attending Physician Gastroenterology  08/16/16   [name], MD Consulting Physician Obstetrics and Gynecology  08/16/16     Chief Complaint  Patient presents with  . Hypertension    Needs refills. Pt checks BP at home, 120's/60's. Pt takes BP med at night      Subjective: Brianna Conrad is a 64 y.o. female present for [facility] follow up . Hypertension/left carotid bruit/HLD/heart murmur:: Pt reports complaince with HCTZ '25mg'$  QD and losartan 25 . Blood pressures ranges at home are mostly around 120/60.Patient denies chest pain, shortness of breath, dizziness or lower extremity edema.   Pt does not take a daily baby ASA. Pt is  prescribed statin And fenofibrate. BMP: 10/2018 within normal limits CBC: 10/2018 within normal limits Lipid: 10/2018, WNL Tsh; 10/2018 Diet: Low-sodium Exercise: Not routinely RF: Hypertension, hyperlipidemia, family history stroke  Depression screen [facility] 2/9 05/26/2019 05/13/2018 10/16/2017 08/16/2016 08/11/2015  Decreased Interest 0 0 - 0 0  Down, Depressed, Hopeless 0 0 0 0 0  PHQ - 2 Score 0 0 0 0 0    Allergies  Allergen Reactions  . Amoxicillin Rash  . Penicillins Rash   Social History   Tobacco Use  . Smoking status: Never Smoker  . Smokeless tobacco: Never Used  Substance Use Topics  . Alcohol use: Yes    Comment: RARE   Past Medical History:  Diagnosis Date  . Atypical glandular cells on Pap smear   . Cancer ([place]) 2014   basal cell skin cancer  . Diverticulosis   . History of chicken pox   . History of colon polyps    BENIGN  2013  . History of squamous cell carcinoma excision    RIGHT SHOULDER  . Hyperlipidemia   . Hypertension   . Vaginal delivery 6712,4580, 1993   Past Surgical History:  Procedure Laterality Date  .  CERVICAL CONIZATION W/BX N/A 06/20/2013   Procedure: CONIZATION CERVIX WITH BIOPSY;  Surgeon: [name], MD;  Location: [facility];  Service: Gynecology;  Laterality: N/A;  . COLONOSCOPY W/ POLYPECTOMY  09/2011  . HYSTEROSCOPY WITH D & C N/A 06/20/2013   Procedure: DILATATION AND CURETTAGE /HYSTEROSCOPY;  Surgeon: [name], MD;  Location: [facility];  Service: Gynecology;  Laterality: N/A;  . TUBAL LIGATION  1993   POST PARTUM   Family History  Problem Relation Age of Onset  . Diabetes Father   . Colon cancer Brother 20  . Diabetes Paternal Uncle   . Diabetes Paternal Grandmother   . Stroke Paternal Grandfather    Allergies as of 12/09/2019      Reactions   Amoxicillin Rash   Penicillins Rash      Medication List       Accurate as of Dec 09, 2019  9:15 AM. If you have any questions, ask your nurse or doctor.        STOP taking these medications   meloxicam 15 MG tablet Commonly known as: MOBIC Stopped by: Howard Pouch, DO     TAKE these medications   atorvastatin 10 MG tablet Commonly known as: LIPITOR Take 1 tablet (10 mg total) by mouth every morning.   fenofibrate 54 MG tablet Take 1 tablet (54 mg total) by mouth every morning. What changed: additional instructions Changed  by: Howard Pouch, DO   hydrochlorothiazide 25 MG tablet Commonly known as: HYDRODIURIL Take 1 tablet (25 mg total) by mouth daily.   losartan 25 MG tablet Commonly known as: Cozaar Take 1 tablet (25 mg total) by mouth daily.       All past medical history, surgical history, allergies, family history, immunizations andmedications were updated in the EMR today and reviewed under the history and medication portions of their EMR.     ROS: Negative, with the exception of above mentioned in HPI   Objective:  BP 138/76 (BP Location: Right Arm, Patient Position: Sitting, Cuff Size: Normal)   Temp 97.7 F (36.5 C) (Temporal)   Resp 17   Ht '5\' 4"'$  (1.626 m)   Wt 159 lb 2 oz (72.2 kg)    LMP 08/07/2015   SpO2 98%   BMI 27.31 kg/m  Body mass index is 27.31 kg/m. Gen: Afebrile. No acute distress.  HENT: AT. [place].  Eyes:Pupils Equal Round Reactive to light, Extraocular movements intact,  Conjunctiva without redness, discharge or icterus. Neck/lymp/endocrine: Supple,no lymphadenopathy, no thyromegaly CV: RRR 1/6 SM, no edema, +2/4 P posterior tibialis pulses Chest: CTAB, no wheeze or crackles  Neuro:  Normal gait. PERLA. EOMi. Alert. Oriented x3  Psych: Normal affect, dress and demeanor. Normal speech. Normal thought content and judgment.   No exam data present No results found. No results found for this or any previous visit (from the past 24 hour(s)).  Assessment/Plan: Brianna Conrad is a 64 y.o. female present for OV for  Essential hypertension, benign Hyperlipidemia LDL goal <100 Left carotid bruit Heart murmur - stable.  -  Continue losartan 25 mg daily  - continue HCTZ to 25 mg.  - continue  Fenofibrate - continue statin - Encouraged baby aspirin, if not taking NSAIDS routinely.  - Low-sodium diet, routine exercise encouraged. - Heart murmur present since childhood - cbc, cmp, tsh and lipids collected today - Carotid Doppler studies completed January 2018, minimal heterogeneous plaque, with no hemodynamically significant stenosis by duplex criteria and extracranial cerebrovascular circulation. - Follow-up in 5.5 months    Reviewed expectations re: course of current medical issues.  Discussed self-management of symptoms.  Outlined signs and symptoms indicating need for more acute intervention.  Patient verbalized understanding and all questions were answered.  Patient received an After-Visit Summary.   Orders Placed This Encounter  Procedures  . CBC  . Comp Met (CMET)  . Lipid panel  . TSH   Meds ordered this encounter  Medications  . losartan (COZAAR) 25 MG tablet    Sig: Take 1 tablet (25 mg total) by mouth daily.    Dispense:  90 tablet     Refill:  1    90 d supply- ignore prior script  . hydrochlorothiazide (HYDRODIURIL) 25 MG tablet    Sig: Take 1 tablet (25 mg total) by mouth daily.    Dispense:  90 tablet    Refill:  1  . fenofibrate 54 MG tablet    Sig: Take 1 tablet (54 mg total) by mouth every morning.    Dispense:  90 tablet    Refill:  1  . atorvastatin (LIPITOR) 10 MG tablet    Sig: Take 1 tablet (10 mg total) by mouth every morning.    Dispense:  90 tablet    Refill:  1   Referral Orders  No referral(s) requested today     Note is dictated utilizing voice recognition software. Although note has been proof read  prior to signing, occasional typographical errors still can be missed. If any questions arise, please do not hesitate to call for verification.   electronically signed by:  Howard Pouch, DO  [place]

## 2019-12-09 NOTE — Patient Instructions (Signed)
I have refilled your medications for you.  Make sure to schedule your colonoscopy We will call you with lab results.  Keep up the great work with weight loss and exercise.

## 2020-05-10 ENCOUNTER — Telehealth: Payer: Self-pay

## 2020-05-10 NOTE — Telephone Encounter (Signed)
Patient cancelled appt with Dr. Raoul Pitch on 10/18.  She will be leaving state to go to [place] to help take care of her mother who is recently diagnosis with terminal disease.  She is not sure how long she will be gone. Patient is leaving next week.  Patient concerned about her blood pressure meds. I told her not to worry. If we have to do a visit, we can do a virtual appt.    Brianna Conrad can be reached at [phone] for questions

## 2020-05-10 NOTE — Telephone Encounter (Signed)
Ok to virtual.

## 2020-05-10 NOTE — Telephone Encounter (Signed)
Pt due for f/u is it okay to sched for video?

## 2020-05-24 ENCOUNTER — Ambulatory Visit: Payer: BC Managed Care – PPO | Admitting: Family Medicine

## 2020-05-27 ENCOUNTER — Ambulatory Visit: Payer: BC Managed Care – PPO | Admitting: Family Medicine

## 2020-06-13 ENCOUNTER — Other Ambulatory Visit: Payer: Self-pay | Admitting: Family Medicine

## 2020-06-13 DIAGNOSIS — E785 Hyperlipidemia, unspecified: Secondary | ICD-10-CM

## 2020-07-07 DIAGNOSIS — L309 Dermatitis, unspecified: Secondary | ICD-10-CM | POA: Diagnosis not present

## 2020-07-07 DIAGNOSIS — L570 Actinic keratosis: Secondary | ICD-10-CM | POA: Diagnosis not present

## 2020-07-07 DIAGNOSIS — C44319 Basal cell carcinoma of skin of other parts of face: Secondary | ICD-10-CM | POA: Diagnosis not present

## 2020-07-07 DIAGNOSIS — C44311 Basal cell carcinoma of skin of nose: Secondary | ICD-10-CM | POA: Diagnosis not present

## 2020-07-07 DIAGNOSIS — D485 Neoplasm of uncertain behavior of skin: Secondary | ICD-10-CM | POA: Diagnosis not present

## 2020-07-08 DIAGNOSIS — M25561 Pain in right knee: Secondary | ICD-10-CM | POA: Diagnosis not present

## 2020-07-30 ENCOUNTER — Other Ambulatory Visit: Payer: Self-pay | Admitting: Family Medicine

## 2020-07-30 DIAGNOSIS — E785 Hyperlipidemia, unspecified: Secondary | ICD-10-CM

## 2020-08-11 DIAGNOSIS — Z6829 Body mass index (BMI) 29.0-29.9, adult: Secondary | ICD-10-CM | POA: Diagnosis not present

## 2020-08-11 DIAGNOSIS — Z13820 Encounter for screening for osteoporosis: Secondary | ICD-10-CM | POA: Diagnosis not present

## 2020-08-11 DIAGNOSIS — Z01419 Encounter for gynecological examination (general) (routine) without abnormal findings: Secondary | ICD-10-CM | POA: Diagnosis not present

## 2020-08-11 DIAGNOSIS — Z1231 Encounter for screening mammogram for malignant neoplasm of breast: Secondary | ICD-10-CM | POA: Diagnosis not present

## 2020-08-27 DIAGNOSIS — C44311 Basal cell carcinoma of skin of nose: Secondary | ICD-10-CM | POA: Diagnosis not present

## 2020-08-30 ENCOUNTER — Other Ambulatory Visit: Payer: Self-pay

## 2020-08-31 ENCOUNTER — Encounter: Payer: Self-pay | Admitting: Family Medicine

## 2020-08-31 ENCOUNTER — Telehealth: Payer: Self-pay | Admitting: Family Medicine

## 2020-08-31 ENCOUNTER — Ambulatory Visit (INDEPENDENT_AMBULATORY_CARE_PROVIDER_SITE_OTHER): Payer: BC Managed Care – PPO | Admitting: Family Medicine

## 2020-08-31 VITALS — BP 160/74 | HR 79 | Temp 98.5°F | Ht 63.5 in | Wt 170.0 lb

## 2020-08-31 DIAGNOSIS — Z5181 Encounter for therapeutic drug level monitoring: Secondary | ICD-10-CM

## 2020-08-31 DIAGNOSIS — Z79899 Other long term (current) drug therapy: Secondary | ICD-10-CM

## 2020-08-31 DIAGNOSIS — K635 Polyp of colon: Secondary | ICD-10-CM

## 2020-08-31 DIAGNOSIS — Z0000 Encounter for general adult medical examination without abnormal findings: Secondary | ICD-10-CM | POA: Diagnosis not present

## 2020-08-31 DIAGNOSIS — E785 Hyperlipidemia, unspecified: Secondary | ICD-10-CM

## 2020-08-31 DIAGNOSIS — I10 Essential (primary) hypertension: Secondary | ICD-10-CM

## 2020-08-31 DIAGNOSIS — Z131 Encounter for screening for diabetes mellitus: Secondary | ICD-10-CM | POA: Diagnosis not present

## 2020-08-31 DIAGNOSIS — R011 Cardiac murmur, unspecified: Secondary | ICD-10-CM

## 2020-08-31 LAB — COMPREHENSIVE METABOLIC PANEL
ALT: 23 U/L (ref 0–35)
AST: 15 U/L (ref 0–37)
Albumin: 4.9 g/dL (ref 3.5–5.2)
Alkaline Phosphatase: 56 U/L (ref 39–117)
BUN: 17 mg/dL (ref 6–23)
CO2: 33 mEq/L — ABNORMAL HIGH (ref 19–32)
Calcium: 10.2 mg/dL (ref 8.4–10.5)
Chloride: 100 mEq/L (ref 96–112)
Creatinine, Ser: 0.84 mg/dL (ref 0.40–1.20)
GFR: 73.97 mL/min (ref >60.00)
Glucose, Bld: 97 mg/dL (ref 70–99)
Potassium: 3.6 mEq/L (ref 3.5–5.1)
Sodium: 139 mEq/L (ref 135–145)
Total Bilirubin: 0.9 mg/dL (ref 0.2–1.2)
Total Protein: 7.2 g/dL (ref 6.0–8.3)

## 2020-08-31 LAB — LIPID PANEL
Cholesterol: 281 mg/dL — ABNORMAL HIGH (ref 0–200)
HDL: 42.8 mg/dL (ref >39.00)
NonHDL: 238.15
Total CHOL/HDL Ratio: 7
Triglycerides: 287 mg/dL — ABNORMAL HIGH (ref 0.0–149.0)
VLDL: 57.4 mg/dL — ABNORMAL HIGH (ref 0.0–40.0)

## 2020-08-31 LAB — CBC
HCT: 42.6 % (ref 36.0–46.0)
Hemoglobin: 14.8 g/dL (ref 12.0–15.0)
MCHC: 34.8 g/dL (ref 30.0–36.0)
MCV: 89.9 fl (ref 78.0–100.0)
Platelets: 202 10*3/uL (ref 150.0–400.0)
RBC: 4.74 Mil/uL (ref 3.87–5.11)
RDW: 12.3 % (ref 11.5–15.5)
WBC: 5.5 10*3/uL (ref 4.0–10.5)

## 2020-08-31 LAB — HEMOGLOBIN A1C: Hgb A1c MFr Bld: 5.5 % (ref 4.6–6.5)

## 2020-08-31 LAB — LDL CHOLESTEROL, DIRECT: Direct LDL: 174 mg/dL

## 2020-08-31 LAB — TSH: TSH: 2.28 u[IU]/mL (ref 0.35–4.50)

## 2020-08-31 MED ORDER — LOSARTAN POTASSIUM 25 MG PO TABS
ORAL_TABLET | ORAL | 1 refills | Status: DC
Start: 2020-08-31 — End: 2020-11-17

## 2020-08-31 MED ORDER — FENOFIBRATE 54 MG PO TABS: 54.0000 mg | ORAL_TABLET | Freq: Every morning | ORAL | 1 refills | Status: DC

## 2020-08-31 MED ORDER — ATORVASTATIN CALCIUM 10 MG PO TABS
ORAL_TABLET | ORAL | 0 refills | Status: DC
Start: 2020-08-31 — End: 2020-09-01

## 2020-08-31 MED ORDER — HYDROCHLOROTHIAZIDE 25 MG PO TABS: 25.0000 mg | ORAL_TABLET | Freq: Every day | ORAL | 1 refills | Status: DC

## 2020-08-31 NOTE — Progress Notes (Signed)
This visit occurred during the SARS-CoV-2 public health emergency.  Safety protocols were in place, including screening questions prior to the visit, additional usage of staff PPE, and extensive cleaning of exam room while observing appropriate contact time as indicated for disinfecting solutions.    Patient ID: Brianna Conrad, female  DOB: 12/01/1955, 65 y.o.   MRN: [id number] Patient Care Team    Relationship Specialty Notifications Start End  Ma Hillock, DO PCP - General Family Medicine  07/14/15   Milus Banister, MD Attending Physician Gastroenterology  08/16/16   [name], MD Consulting Physician Obstetrics and Gynecology  08/16/16   [facility]    08/31/20    Comment: Dr. Dede Query    Chief Complaint  Patient presents with  . Annual Exam    Pt is not fasting    Subjective:  Brianna Conrad is a 65 y.o.  Female  present for CPE. All past medical history, surgical history, allergies, family history, immunizations, medications and social history were updated in the electronic medical record today. All recent labs, ED visits and hospitalizations within the last year were reviewed.  Health maintenance:  Colonoscopy: fhx brother 86, personal history of colon polyps. Last colonoscopy 2013, told to return in 5 years. Due in 2018. Dr. [name].  Mammogram: No fhx, 07/11/2019 Dr. [name] ordered today  Cervical cancer screening: Last Pap smear 2019.  Dr. [name].  Immunizations: Patient states she does not believe in a flu shot, declined. covid series completed- booster overdue. Tdap UTD 2017. shingrix - elected to wait for now.  Infectious disease screening: HIV and hepatitis C completed Assistive device: none Oxygen PPJ:KDTO Patient has a Dental home. Hospitalizations/ED visits: reviewed  Hypertension/left carotid bruit/HLD/heart murmur::  Pt reports compliance  with HCTZ 25 mg QD and losartan 25 . Blood pressures ranges at home are mostly around 116-137/60-69. Pt has  not taken her BP med yet today. Patient denies chest pain, shortness of breath, dizziness or lower extremity edema.  Pt does not take a daily baby ASA. Pt is  prescribed statin and fenofibrate. Diet: Low-sodium Exercise: Not routinely RF: Hypertension, hyperlipidemia, family history stroke  Depression screen [facility] 2/9 08/31/2020 05/26/2019 05/13/2018 10/16/2017 08/16/2016  Decreased Interest 0 0 0 - 0  Down, Depressed, Hopeless 0 0 0 0 0  PHQ - 2 Score 0 0 0 0 0   No flowsheet data found.   Immunization History  Administered Date(s) Administered  . Influenza Inj Mdck Quad Pf 07/15/2018  . Influenza,inj,Quad PF,6+ Mos 05/26/2019  . Influenza-Unspecified 06/25/2013, 07/01/2014  . Moderna Sars-Covid-2 Vaccination 10/24/2019, 11/25/2019  . Tdap 08/18/2015   Past Medical History:  Diagnosis Date  . Atypical glandular cells on Pap smear   . Basal cell carcinoma   . Cancer ([place]) 2014   basal cell skin cancer  . Diverticulosis   . History of chicken pox   . History of colon polyps    BENIGN  2013  . History of squamous cell carcinoma excision    RIGHT SHOULDER  . Hyperlipidemia   . Hypertension   . Vaginal delivery [id number], 1993   Allergies  Allergen Reactions  . Amoxicillin Rash  . Penicillins Rash   Past Surgical History:  Procedure Laterality Date  . CERVICAL CONIZATION W/BX N/A 06/20/2013   Procedure: CONIZATION CERVIX WITH BIOPSY;  Surgeon: [name], MD;  Location: [facility];  Service: Gynecology;  Laterality: N/A;  . COLONOSCOPY W/ POLYPECTOMY  09/2011  . HYSTEROSCOPY WITH D & C N/A  06/20/2013   Procedure: DILATATION AND CURETTAGE /HYSTEROSCOPY;  Surgeon: [name], MD;  Location: [facility];  Service: Gynecology;  Laterality: N/A;  . SKIN BIOPSY    . TUBAL LIGATION  1993   POST PARTUM   Family History  Problem Relation Age of Onset  . Diabetes Father   . Colon cancer Brother 30  . Gallbladder disease Mother   . Cancer Mother   . Diabetes Paternal Uncle   .  Diabetes Paternal Grandmother   . Stroke Paternal Grandfather    Social History   Social History Narrative   Married, Tom. 3 children.   Retired Film/video editor). College degree.    Drinks caffeinated beverages.   Wears her seatbelt, exercises at least 3 times a week, smoke detectors in the home.   No firearms in the home.   Feels safe in her relationships.    Allergies as of 08/31/2020      Reactions   Amoxicillin Rash   Penicillins Rash      Medication List       Accurate as of August 31, 2020 12:59 PM. If you have any questions, ask your nurse or doctor.        atorvastatin 10 MG tablet Commonly known as: LIPITOR TAKE 1 TABLET(10 MG) BY MOUTH EVERY MORNING   fenofibrate 54 MG tablet Take 1 tablet (54 mg total) by mouth every morning.   hydrochlorothiazide 25 MG tablet Commonly known as: HYDRODIURIL Take 1 tablet (25 mg total) by mouth daily.   losartan 25 MG tablet Commonly known as: COZAAR TAKE 1 TABLET(25 MG) BY MOUTH DAILY       All past medical history, surgical history, allergies, family history, immunizations andmedications were updated in the EMR today and reviewed under the history and medication portions of their EMR.     No results found for this or any previous visit (from the past 2160 hour(s)).  US BREAST LTD UNI RIGHT INC AXILLA  Result Date: 07/23/2019 RECOMMENDATION: Screening mammogram in one year.(Code:SM-B-01Y) I have discussed the findings and recommendations with the patient. If applicable, a reminder letter will be sent to the patient regarding the next appointment. BI-RADS CATEGORY  1: Negative. Electronically Signed   By: Nolon Nations M.D.   On: 07/23/2019 11:30   MM DIAG BREAST TOMO UNI RIGHT Result Date: 07/23/2019  RECOMMENDATION: Screening mammogram in one year.(Code:SM-B-01Y) I have discussed the findings and recommendations with the patient. If applicable, a reminder letter will be sent to the patient regarding the next  appointment. BI-RADS CATEGORY  1: Negative. Electronically Signed   By: Nolon Nations M.D.   On: 07/23/2019 11:30     ROS: 14 pt review of systems performed and negative (unless mentioned in an HPI)  Objective: BP (!) 160/74   Pulse 79   Temp 98.5 F (36.9 C) (Oral)   Ht 5' 3.5" (1.613 m)   Wt 170 lb (77.1 kg)   LMP 08/07/2015   SpO2 100%   BMI 29.64 kg/m  Gen: Afebrile. No acute distress. Nontoxic in appearance, well-developed, well-nourished, thin female HENT: AT. [place]. Bilateral TM visualized and normal in appearance, normal external auditory canal. MMM, no oral lesions, adequate dentition. Bilateral nares within normal limits. Throat without erythema, ulcerations or exudates.  No cough on exam, no hoarseness on exam. Eyes:Pupils Equal Round Reactive to light, Extraocular movements intact,  Conjunctiva without redness, discharge or icterus. Neck/lymp/endocrine: Supple, no lymphadenopathy, no thyromegaly CV: RRR 1/6 systolic murmur, no edema, +2/4 P posterior  tibialis pulses.  Chest: CTAB, no wheeze, rhonchi or crackles.  Normal respiratory effort.  Good air movement. Abd: Soft.  Flat. NTND. BS present.  No masses palpated. No hepatosplenomegaly. No rebound tenderness or guarding. Skin: No rashes, purpura or petechiae. Warm and well-perfused. Skin intact. Neuro/Msk: Normal gait. PERLA. EOMi. Alert. Oriented x3.  Cranial nerves II through XII intact. Muscle strength 5/5 upper/lower extremity. DTRs equal bilaterally. Psych: Normal affect, dress and demeanor. Normal speech. Normal thought content and judgment.   No exam data present  Assessment/plan: Brianna Conrad is a 65 y.o. female present for CPE/CMC Essential hypertension, benign Hyperlipidemia LDL goal <100 Left carotid bruit Heart murmur - Able -  Continue losartan 25 mg daily  -Continue HCTZ to 25 mg.  - continue  Fenofibrate - continue statin - not fasting.  - Encouraged baby aspirin, if not taking NSAIDS routinely.   - Low-sodium diet, routine exercise encouraged. - Heart murmur present since childhood - cbc, cmp, tsh and lipids collected today - Carotid Doppler studies completed January 2018, minimal heterogeneous plaque, with no hemodynamically significant stenosis by duplex criteria and extracranial cerebrovascular circulation. - Follow-up in 5.5 months   Diabetes mellitus screening - Hemoglobin A1c  Polyp of colon, unspecified part of colon, unspecified type - Ambulatory referral to Gastroenterology  Encounter for preventative exam: Patient was encouraged to exercise greater than 150 minutes a week. Patient was encouraged to choose a diet filled with fresh fruits and vegetables, and lean meats. AVS provided to patient today for education/recommendation on gender specific health and safety maintenance. Colonoscopy: fhx brother 68, personal history of colon polyps. Last colonoscopy 2013, told to return in 5 years. Due in 2018. Dr. [name].  Mammogram: No fhx, 07/11/2019 Dr. [name] ordered today  Cervical cancer screening: Last Pap smear 2019.  Dr. [name].  Immunizations: Patient states she does not believe in a flu shot, declined. covid series completed- booster overdue. Tdap UTD 2017. shingrix - elected to wait for now.  Infectious disease screening: HIV and hepatitis C completed  Return in about 5 months (around 02/14/2021) for [place] (30 min).  Orders Placed This Encounter  Procedures  . Comprehensive metabolic panel  . Lipid panel  . TSH  . CBC  . Hemoglobin A1c  . Ambulatory referral to Gastroenterology    Orders Placed This Encounter  Procedures  . Comprehensive metabolic panel  . Lipid panel  . TSH  . CBC  . Hemoglobin A1c  . Ambulatory referral to Gastroenterology   Meds ordered this encounter  Medications  . losartan (COZAAR) 25 MG tablet    Sig: TAKE 1 TABLET(25 MG) BY MOUTH DAILY    Dispense:  90 tablet    Refill:  1  . hydrochlorothiazide (HYDRODIURIL) 25 MG tablet     Sig: Take 1 tablet (25 mg total) by mouth daily.    Dispense:  90 tablet    Refill:  1  . fenofibrate 54 MG tablet    Sig: Take 1 tablet (54 mg total) by mouth every morning.    Dispense:  90 tablet    Refill:  1  . atorvastatin (LIPITOR) 10 MG tablet    Sig: TAKE 1 TABLET(10 MG) BY MOUTH EVERY MORNING    Dispense:  30 tablet    Refill:  0    Referral Orders     Ambulatory referral to Gastroenterology   Electronically signed by: Howard Pouch, [facility] [place]

## 2020-08-31 NOTE — Telephone Encounter (Signed)
Please call patient Liver, kidney and thyroid function are normal Blood cell counts and electrolytes are normal Diabetes screening/A1c is normal at 5.5 Cholesterol panel is extremely above goal.  Is she taking the atorvastatin and fenofibrate?  Her LDL/bad cholesterol is 174-this is back to the level it was in 2017.  This puts her at much higher risk of heart attack and stroke.  -If she states that she is taking the statin and fenofibrate, then we need to increase the doses on those and follow-up in 3 months with provider fasting for recheck.  Please advise if patient reports she was taking statin.

## 2020-08-31 NOTE — Patient Instructions (Signed)
Health Maintenance, Female Adopting a healthy lifestyle and getting preventive care are important in promoting health and wellness. Ask your health care provider about:  The right schedule for you to have regular tests and exams.  Things you can do on your own to prevent diseases and keep yourself healthy. What should I know about diet, weight, and exercise? Eat a healthy diet  Eat a diet that includes plenty of vegetables, fruits, low-fat dairy products, and lean protein.  Do not eat a lot of foods that are high in solid fats, added sugars, or sodium.   Maintain a healthy weight Body mass index (BMI) is used to identify weight problems. It estimates body fat based on height and weight. Your health care provider can help determine your BMI and help you achieve or maintain a healthy weight. Get regular exercise Get regular exercise. This is one of the most important things you can do for your health. Most adults should:  Exercise for at least 150 minutes each week. The exercise should increase your heart rate and make you sweat (moderate-intensity exercise).  Do strengthening exercises at least twice a week. This is in addition to the moderate-intensity exercise.  Spend less time sitting. Even light physical activity can be beneficial. Watch cholesterol and blood lipids Have your blood tested for lipids and cholesterol at 64 years of age, then have this test every 5 years. Have your cholesterol levels checked more often if:  Your lipid or cholesterol levels are high.  You are older than 64 years of age.  You are at high risk for heart disease. What should I know about cancer screening? Depending on your health history and family history, you may need to have cancer screening at various ages. This may include screening for:  Breast cancer.  Cervical cancer.  Colorectal cancer.  Skin cancer.  Lung cancer. What should I know about heart disease, diabetes, and high blood  pressure? Blood pressure and heart disease  High blood pressure causes heart disease and increases the risk of stroke. This is more likely to develop in people who have high blood pressure readings, are of African descent, or are overweight.  Have your blood pressure checked: ? Every 3-5 years if you are 18-39 years of age. ? Every year if you are 40 years old or older. Diabetes Have regular diabetes screenings. This checks your fasting blood sugar level. Have the screening done:  Once every three years after age 40 if you are at a normal weight and have a low risk for diabetes.  More often and at a younger age if you are overweight or have a high risk for diabetes. What should I know about preventing infection? Hepatitis B If you have a higher risk for hepatitis B, you should be screened for this virus. Talk with your health care provider to find out if you are at risk for hepatitis B infection. Hepatitis C Testing is recommended for:  Everyone born from 1945 through 1965.  Anyone with known risk factors for hepatitis C. Sexually transmitted infections (STIs)  Get screened for STIs, including gonorrhea and chlamydia, if: ? You are sexually active and are younger than 64 years of age. ? You are older than 64 years of age and your health care provider tells you that you are at risk for this type of infection. ? Your sexual activity has changed since you were last screened, and you are at increased risk for chlamydia or gonorrhea. Ask your health care provider   if you are at risk.  Ask your health care provider about whether you are at high risk for HIV. Your health care provider may recommend a prescription medicine to help prevent HIV infection. If you choose to take medicine to prevent HIV, you should first get tested for HIV. You should then be tested every 3 months for as long as you are taking the medicine. Pregnancy  If you are about to stop having your period (premenopausal) and  you may become pregnant, seek counseling before you get pregnant.  Take 400 to 800 micrograms (mcg) of folic acid every day if you become pregnant.  Ask for birth control (contraception) if you want to prevent pregnancy. Osteoporosis and menopause Osteoporosis is a disease in which the bones lose minerals and strength with aging. This can result in bone fractures. If you are 65 years old or older, or if you are at risk for osteoporosis and fractures, ask your health care provider if you should:  Be screened for bone loss.  Take a calcium or vitamin D supplement to lower your risk of fractures.  Be given hormone replacement therapy (HRT) to treat symptoms of menopause. Follow these instructions at home: Lifestyle  Do not use any products that contain nicotine or tobacco, such as cigarettes, e-cigarettes, and chewing tobacco. If you need help quitting, ask your health care provider.  Do not use street drugs.  Do not share needles.  Ask your health care provider for help if you need support or information about quitting drugs. Alcohol use  Do not drink alcohol if: ? Your health care provider tells you not to drink. ? You are pregnant, may be pregnant, or are planning to become pregnant.  If you drink alcohol: ? Limit how much you use to 0-1 drink a day. ? Limit intake if you are breastfeeding.  Be aware of how much alcohol is in your drink. In the [place] one drink equals one 12 oz bottle of beer (355 mL), one 5 oz glass of wine (148 mL), or one 1 oz glass of hard liquor (44 mL). General instructions  Schedule regular health, dental, and eye exams.  Stay current with your vaccines.  Tell your health care provider if: ? You often feel depressed. ? You have ever been abused or do not feel safe at home. Summary  Adopting a healthy lifestyle and getting preventive care are important in promoting health and wellness.  Follow your health care provider's instructions about healthy  diet, exercising, and getting tested or screened for diseases.  Follow your health care provider's instructions on monitoring your cholesterol and blood pressure. This information is not intended to replace advice given to you by your health care provider. Make sure you discuss any questions you have with your health care provider. Document Revised: 07/17/2018 Document Reviewed: 07/17/2018 Elsevier Patient Education  2021 Elsevier Inc.  

## 2020-09-01 MED ORDER — ATORVASTATIN CALCIUM 20 MG PO TABS
ORAL_TABLET | ORAL | 3 refills | Status: DC
Start: 2020-09-01 — End: 2020-11-17

## 2020-09-01 NOTE — Telephone Encounter (Signed)
FYI

## 2020-09-01 NOTE — Telephone Encounter (Signed)
Has been off of medication for at least 30 days. Picked up meds yesterday and has started taking last night.

## 2020-09-01 NOTE — Telephone Encounter (Signed)
Is going to have colonoscopy done at [facility] in [place]. Referral can be D/c'd

## 2020-09-01 NOTE — Addendum Note (Signed)
Addended by: Howard Pouch A on: 09/01/2020 12:42 PM   Modules accepted: Orders

## 2020-09-02 NOTE — Telephone Encounter (Signed)
Scheduled appt with Dr. Raoul Pitch on 4/13

## 2020-09-10 DIAGNOSIS — C44319 Basal cell carcinoma of skin of other parts of face: Secondary | ICD-10-CM | POA: Diagnosis not present

## 2020-09-15 DIAGNOSIS — Z800 Family history of malignant neoplasm of digestive organs: Secondary | ICD-10-CM | POA: Diagnosis not present

## 2020-09-15 DIAGNOSIS — Z1211 Encounter for screening for malignant neoplasm of colon: Secondary | ICD-10-CM | POA: Diagnosis not present

## 2020-09-15 DIAGNOSIS — Z86010 Personal history of colonic polyps: Secondary | ICD-10-CM | POA: Diagnosis not present

## 2020-09-15 DIAGNOSIS — K5730 Diverticulosis of large intestine without perforation or abscess without bleeding: Secondary | ICD-10-CM | POA: Diagnosis not present

## 2020-09-15 LAB — HM COLONOSCOPY

## 2020-10-26 IMAGING — MG MM DIGITAL DIAGNOSTIC UNILAT*R* W/ TOMO W/ CAD
6 series · 6 of 18 positions shown · non-contrast
Comparison: 07/11/2019 and earlier

CLINICAL DATA: Patient returns after screening study for evaluation
of a possible RIGHT breast asymmetry.

EXAM:
DIGITAL DIAGNOSTIC RIGHT MAMMOGRAM WITH CAD AND TOMO
ULTRASOUND RIGHT BREAST

[R CC synth-2D (1 of 2)]
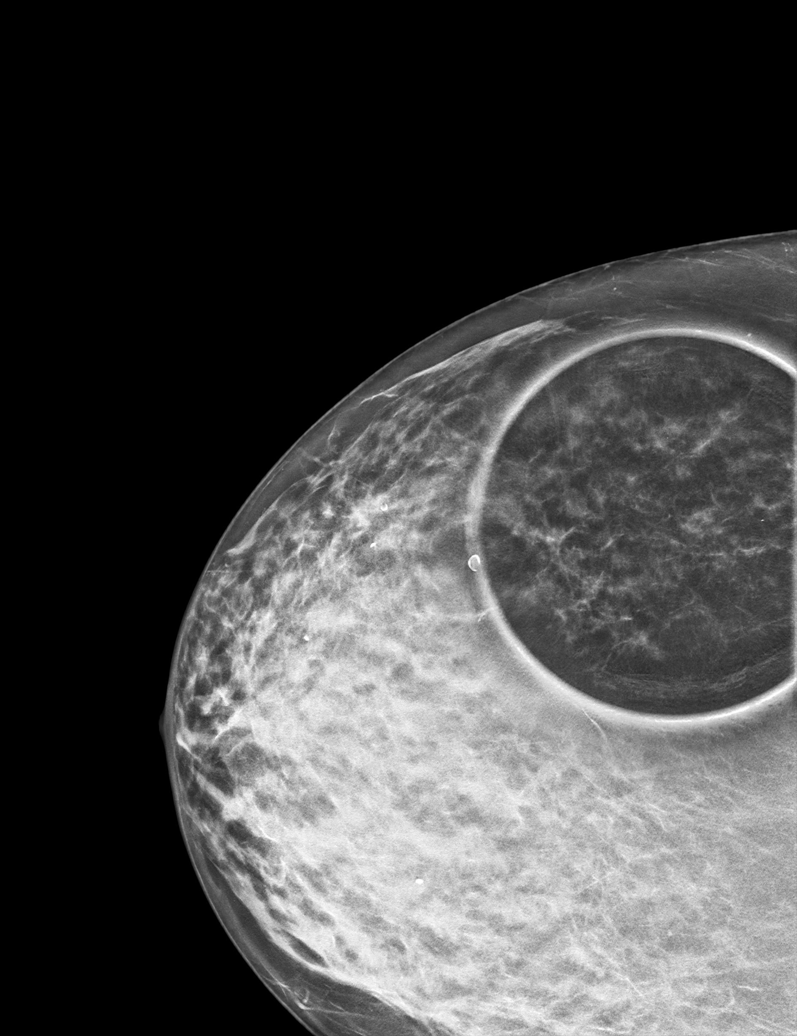

[R CC synth-2D (2 of 2)]
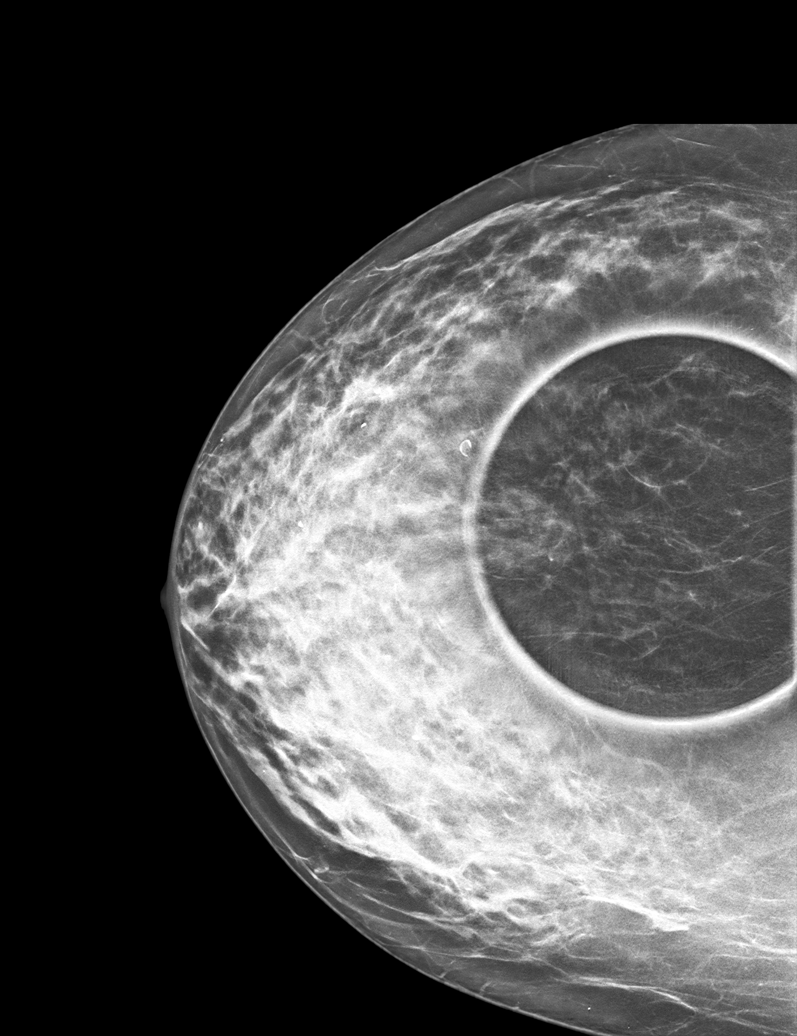

[R ML synth-2D]
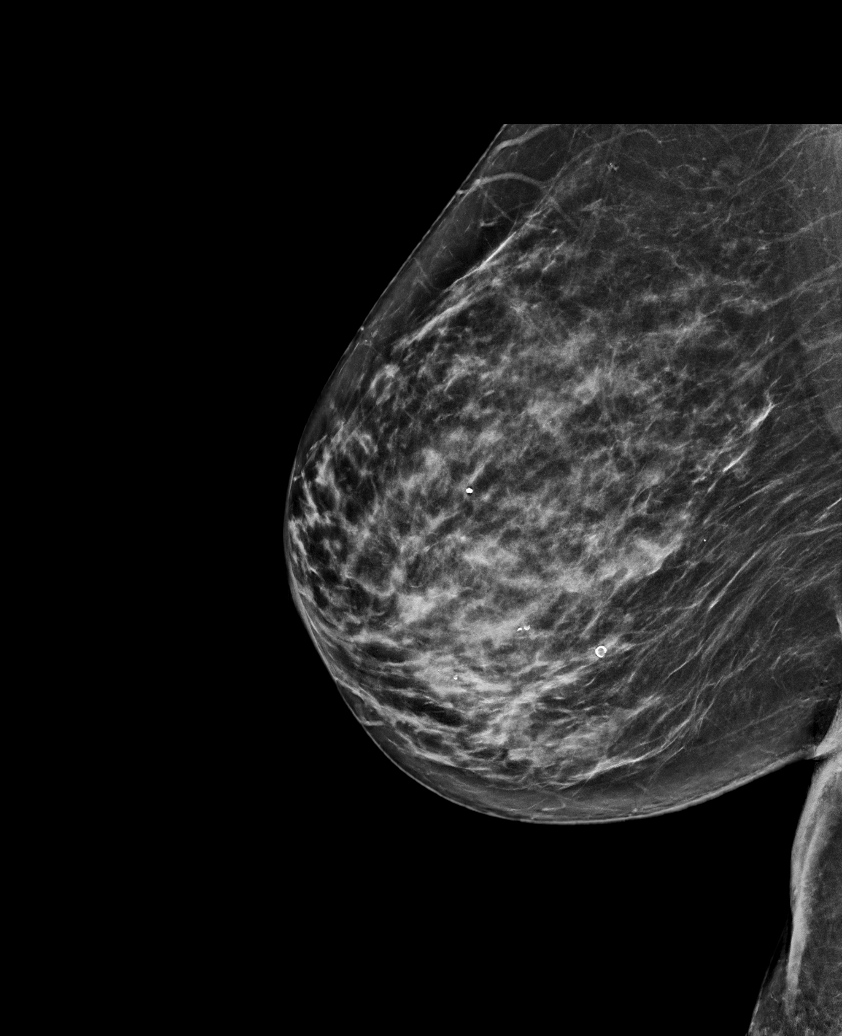

[R ML tomo · tomo slice 38/75.0]
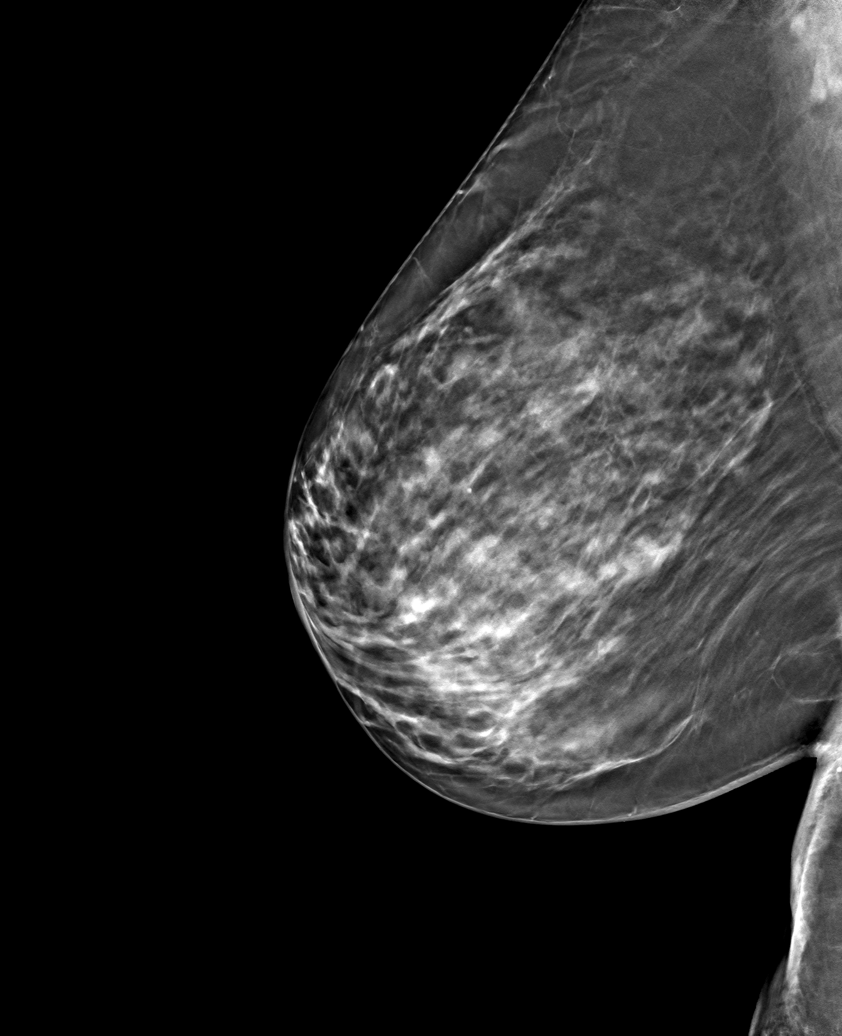

[R CC tomo (1 of 2) · tomo slice 29/56.0]
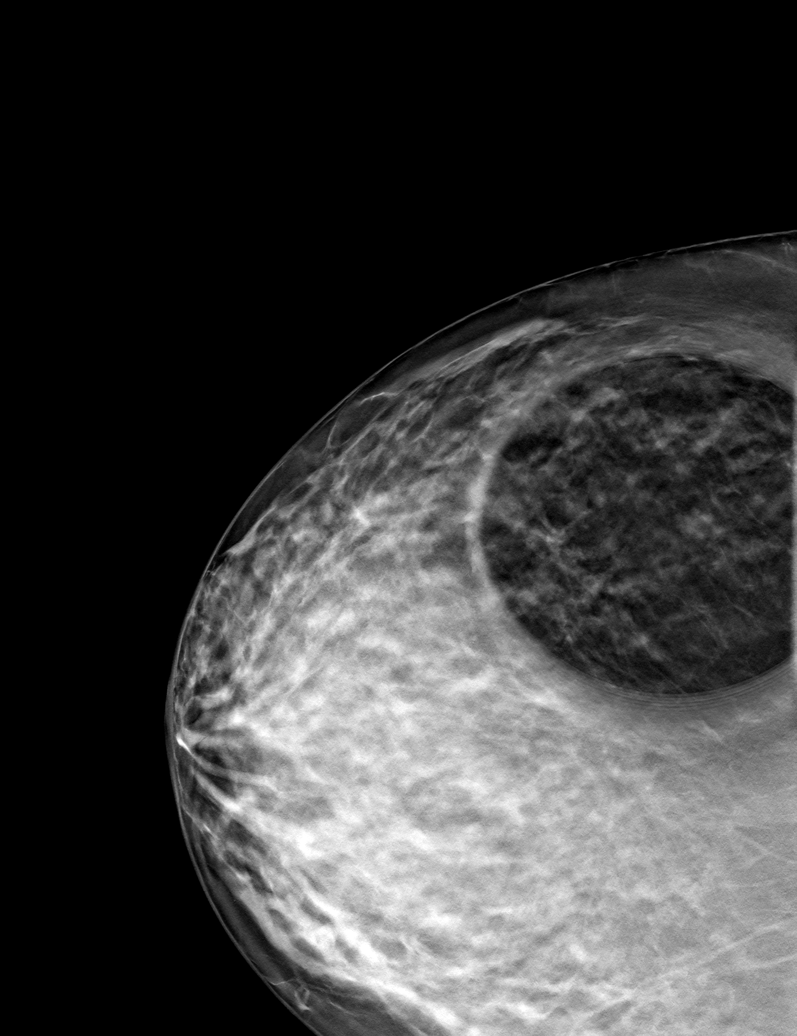

[R CC tomo (2 of 2) · tomo slice 31/60.0]
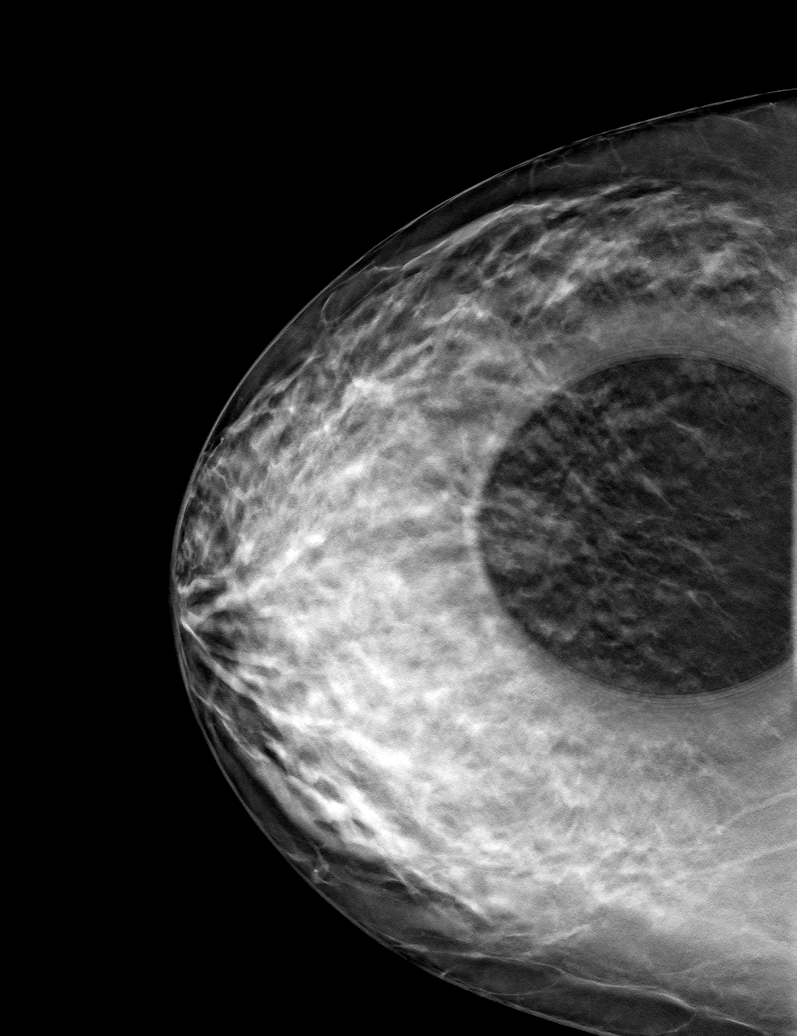

[6 of 18 positions shown; findings below may reference images not displayed]

ACR Breast Density Category c: The breast tissue is heterogeneously
dense, which may obscure small masses.
FINDINGS: Additional 2-D and 3-D images are performed. These views demonstrate
no persistent asymmetry in the LATERAL portion of the RIGHT breast.
A circumscribed oval mass with lucent center is consistent with
intramammary lymph node.

Mammographic images were processed with CAD.

Targeted ultrasound is performed, showing normal appearing dense
fibroglandular tissue in the LATERAL portion of the RIGHT breast. No
suspicious mass, distortion, or acoustic shadowing is demonstrated
with ultrasound.
IMPRESSION: No mammographic or ultrasound evidence for malignancy.

RECOMMENDATION:
Screening mammogram in one year.(Code:[id number])

I have discussed the findings and recommendations with the patient.
If applicable, a reminder letter will be sent to the patient
regarding the next appointment.

BI-RADS CATEGORY  1: Negative.

## 2020-11-17 ENCOUNTER — Encounter: Payer: Self-pay | Admitting: Family Medicine

## 2020-11-17 ENCOUNTER — Ambulatory Visit: Payer: BC Managed Care – PPO | Admitting: Family Medicine

## 2020-11-17 ENCOUNTER — Other Ambulatory Visit: Payer: Self-pay

## 2020-11-17 VITALS — BP 144/80 | HR 62 | Temp 98.1°F | Ht 63.5 in | Wt 162.0 lb

## 2020-11-17 DIAGNOSIS — Z5181 Encounter for therapeutic drug level monitoring: Secondary | ICD-10-CM

## 2020-11-17 DIAGNOSIS — I10 Essential (primary) hypertension: Secondary | ICD-10-CM | POA: Diagnosis not present

## 2020-11-17 DIAGNOSIS — E785 Hyperlipidemia, unspecified: Secondary | ICD-10-CM | POA: Diagnosis not present

## 2020-11-17 DIAGNOSIS — Z79899 Other long term (current) drug therapy: Secondary | ICD-10-CM

## 2020-11-17 DIAGNOSIS — R0989 Other specified symptoms and signs involving the circulatory and respiratory systems: Secondary | ICD-10-CM

## 2020-11-17 LAB — COMPREHENSIVE METABOLIC PANEL
ALT: 21 U/L (ref 0–35)
AST: 17 U/L (ref 0–37)
Albumin: 4.5 g/dL (ref 3.5–5.2)
Alkaline Phosphatase: 60 U/L (ref 39–117)
BUN: 21 mg/dL (ref 6–23)
CO2: 32 mEq/L (ref 19–32)
Calcium: 9.9 mg/dL (ref 8.4–10.5)
Chloride: 98 mEq/L (ref 96–112)
Creatinine, Ser: 0.85 mg/dL (ref 0.40–1.20)
GFR: 72.82 mL/min (ref >60.00)
Glucose, Bld: 94 mg/dL (ref 70–99)
Potassium: 3.6 mEq/L (ref 3.5–5.1)
Sodium: 139 mEq/L (ref 135–145)
Total Bilirubin: 1.3 mg/dL — ABNORMAL HIGH (ref 0.2–1.2)
Total Protein: 7.1 g/dL (ref 6.0–8.3)

## 2020-11-17 LAB — LIPID PANEL
Cholesterol: 124 mg/dL (ref 0–200)
HDL: 44.1 mg/dL (ref >39.00)
LDL Cholesterol: 64 mg/dL (ref 0–99)
NonHDL: 79.79
Total CHOL/HDL Ratio: 3
Triglycerides: 80 mg/dL (ref 0.0–149.0)
VLDL: 16 mg/dL (ref 0.0–40.0)

## 2020-11-17 MED ORDER — LOSARTAN POTASSIUM 50 MG PO TABS: ORAL_TABLET | ORAL | 1 refills | Status: DC

## 2020-11-17 MED ORDER — ATORVASTATIN CALCIUM 20 MG PO TABS
ORAL_TABLET | ORAL | 3 refills | Status: DC
Start: 2020-11-17 — End: 2021-05-04

## 2020-11-17 NOTE — Patient Instructions (Addendum)
   Increase losartan to 50 mg a da. You can take two of your current 25 mg dose losartan until new bottle filled.

## 2020-11-17 NOTE — Progress Notes (Signed)
This visit occurred during the SARS-CoV-2 public health emergency.  Safety protocols were in place, including screening questions prior to the visit, additional usage of staff PPE, and extensive cleaning of exam room while observing appropriate contact time as indicated for disinfecting solutions.    Patient ID: Brianna Conrad, female  DOB: [birth year], 65 y.o.   MRN: [id number] Patient Care Team    Relationship Specialty Notifications Start End  Ma Hillock, DO PCP - General Family Medicine  07/14/15   [name], MD Consulting Physician Obstetrics and Gynecology  08/16/16   [facility]    08/31/20    Comment: Dr. Terrilee Files, Luanna Cole, MD Referring Physician Gastroenterology  09/20/20     Chief Complaint  Patient presents with  . Hyperlipidemia    Pt is fasting  . Hypertension    Subjective:  Brianna Conrad is a 65 y.o.  Female  present for [facility] All recent labs, ED visits and hospitalizations within the last year were reviewed.   Hypertension/left carotid bruit/HLD/heart murmur::  Pt reports compliance   with HCTZ 25 mg QD and losartan 25 . Blood pressures ranges at home. Her last visit her cholesterol was elevated above goal and she endorses stopping her meds prior, she has since restarted. Patient denies chest pain, shortness of breath, dizziness or lower extremity edema.  Pt does not take a daily baby ASA. Pt is  prescribed statin and fenofibrate. Diet: Low-sodium Exercise: Not routinely RF: Hypertension, hyperlipidemia, family history stroke  Depression screen [facility] 2/9 08/31/2020 05/26/2019 05/13/2018 10/16/2017 08/16/2016  Decreased Interest 0 0 0 - 0  Down, Depressed, Hopeless 0 0 0 0 0  PHQ - 2 Score 0 0 0 0 0   No flowsheet data found.   Immunization History  Administered Date(s) Administered  . Influenza Inj Mdck Quad Pf 07/15/2018  . Influenza,inj,Quad PF,6+ Mos 05/26/2019  . Influenza-Unspecified 06/25/2013, 07/01/2014  . Moderna Sars-Covid-2  Vaccination 10/24/2019, 11/25/2019  . Tdap 08/18/2015   Past Medical History:  Diagnosis Date  . Atypical glandular cells on Pap smear   . Basal cell carcinoma   . Cancer ([place]) 2014   basal cell skin cancer  . Diverticulosis   . History of chicken pox   . History of colon polyps    BENIGN  2013  . History of squamous cell carcinoma excision    RIGHT SHOULDER  . Hyperlipidemia   . Hypertension   . Vaginal delivery 0175,1025, 1993   Allergies  Allergen Reactions  . Amoxicillin Rash  . Penicillins Rash   Past Surgical History:  Procedure Laterality Date  . CERVICAL CONIZATION W/BX N/A 06/20/2013   Procedure: CONIZATION CERVIX WITH BIOPSY;  Surgeon: [name], MD;  Location: [facility];  Service: Gynecology;  Laterality: N/A;  . COLONOSCOPY W/ POLYPECTOMY  09/2011  . HYSTEROSCOPY WITH D & C N/A 06/20/2013   Procedure: DILATATION AND CURETTAGE /HYSTEROSCOPY;  Surgeon: [name], MD;  Location: [facility];  Service: Gynecology;  Laterality: N/A;  . SKIN BIOPSY    . TUBAL LIGATION  1993   POST PARTUM   Family History  Problem Relation Age of Onset  . Diabetes Father   . Colon cancer Brother 68  . Gallbladder disease Mother   . Cancer Mother   . Diabetes Paternal Uncle   . Diabetes Paternal Grandmother   . Stroke Paternal Grandfather    Social History   Social History Narrative   Married, Tom. 3 children.   Retired Film/video editor).  College degree.    Drinks caffeinated beverages.   Wears her seatbelt, exercises at least 3 times a week, smoke detectors in the home.   No firearms in the home.   Feels safe in her relationships.    Allergies as of 11/17/2020      Reactions   Amoxicillin Rash   Penicillins Rash      Medication List       Accurate as of November 17, 2020  9:20 AM. If you have any questions, ask your nurse or doctor.        atorvastatin 20 MG tablet Commonly known as: LIPITOR TAKE 1 TABLET(10 MG) BY MOUTH in evening What changed: additional  instructions Changed by: Howard Pouch, DO   fenofibrate 54 MG tablet Take 1 tablet (54 mg total) by mouth every morning.   hydrochlorothiazide 25 MG tablet Commonly known as: HYDRODIURIL Take 1 tablet (25 mg total) by mouth daily.   losartan 50 MG tablet Commonly known as: COZAAR TAKE 1 TABLET(25 MG) BY MOUTH DAILY What changed: medication strength Changed by: Howard Pouch, DO       All past medical history, surgical history, allergies, family history, immunizations andmedications were updated in the EMR today and reviewed under the history and medication portions of their EMR.       ROS: 14 pt review of systems performed and negative (unless mentioned in an HPI)  Objective: BP (!) 144/80   Pulse 62   Temp 98.1 F (36.7 C) (Oral)   Ht 5' 3.5" (1.613 m)   Wt 162 lb (73.5 kg)   LMP 08/07/2015   SpO2 98%   BMI 28.25 kg/m  Gen: Afebrile. No acute distress. Nontoxic, pleasant female.  HENT: AT. [place].  Eyes:Pupils Equal Round Reactive to light, Extraocular movements intact,  Conjunctiva without redness, discharge or icterus. CV: RRR 1/6 SM, no edema, +2/4 P posterior tibialis pulses Chest: CTAB, no wheeze or crackles Skin: no rashes, purpura or petechiae.  Neuro:  Normal gait. PERLA. EOMi. Alert. Oriented x3 Psych: Normal affect, dress and demeanor. Normal speech. Normal thought content and judgment.   No exam data present  Assessment/plan: Brianna Conrad is a 65 y.o. female present for CPE/[facility] Essential hypertension, benign Hyperlipidemia LDL goal <100 Left carotid bruit Heart murmur - above goal.  -  Increase losartan 25 mg-to 50 mg QD -Continue HCTZ to 25 mg.  - continue  Fenofibrate - continue statin - Encouraged baby aspirin, if not taking NSAIDS routinely.  - Low-sodium diet, routine exercise encouraged. - Heart murmur present since childhood - Carotid Doppler studies completed January 2018, minimal heterogeneous plaque, with no hemodynamically significant  stenosis by duplex criteria and extracranial cerebrovascular circulation. - Follow-up in 5.5 months   Return in about 6 months (around 05/09/2021) for CMC (30 min).   Orders Placed This Encounter  Procedures  . Comprehensive metabolic panel  . Lipid panel   Meds ordered this encounter  Medications  . losartan (COZAAR) 50 MG tablet    Sig: TAKE 1 TABLET(25 MG) BY MOUTH DAILY    Dispense:  90 tablet    Refill:  1  . atorvastatin (LIPITOR) 20 MG tablet    Sig: TAKE 1 TABLET(10 MG) BY MOUTH in evening    Dispense:  90 tablet    Refill:  3   Referral Orders  No referral(s) requested today    Electronically signed by: Howard Pouch, [place]

## 2021-02-21 ENCOUNTER — Other Ambulatory Visit: Payer: Self-pay

## 2021-02-21 MED ORDER — HYDROCHLOROTHIAZIDE 25 MG PO TABS: 25.0000 mg | ORAL_TABLET | Freq: Every day | ORAL | 0 refills | Status: DC

## 2021-04-12 ENCOUNTER — Other Ambulatory Visit: Payer: Self-pay

## 2021-04-12 ENCOUNTER — Telehealth: Payer: Self-pay

## 2021-04-12 MED ORDER — HYDROCHLOROTHIAZIDE 25 MG PO TABS: 25.0000 mg | ORAL_TABLET | Freq: Every day | ORAL | 0 refills | Status: DC

## 2021-04-12 NOTE — Telephone Encounter (Signed)
Rx sent to last to appt 

## 2021-04-12 NOTE — Telephone Encounter (Signed)
Patient refill request. Patient has 6 month follow up appt scheduled on 9/28 with Dr. Raoul Pitch  hydrochlorothiazide (HYDRODIURIL) 25 MG tablet [id number]   [place] [id number] - [place]

## 2021-05-04 ENCOUNTER — Ambulatory Visit: Payer: BC Managed Care – PPO | Admitting: Family Medicine

## 2021-05-04 ENCOUNTER — Other Ambulatory Visit: Payer: Self-pay

## 2021-05-04 ENCOUNTER — Encounter: Payer: Self-pay | Admitting: Family Medicine

## 2021-05-04 VITALS — BP 138/72 | HR 65 | Temp 98.1°F | Ht 64.0 in | Wt 162.0 lb

## 2021-05-04 DIAGNOSIS — Z5181 Encounter for therapeutic drug level monitoring: Secondary | ICD-10-CM

## 2021-05-04 DIAGNOSIS — R0989 Other specified symptoms and signs involving the circulatory and respiratory systems: Secondary | ICD-10-CM

## 2021-05-04 DIAGNOSIS — I10 Essential (primary) hypertension: Secondary | ICD-10-CM

## 2021-05-04 DIAGNOSIS — E785 Hyperlipidemia, unspecified: Secondary | ICD-10-CM

## 2021-05-04 DIAGNOSIS — Z79899 Other long term (current) drug therapy: Secondary | ICD-10-CM

## 2021-05-04 DIAGNOSIS — Z23 Encounter for immunization: Secondary | ICD-10-CM | POA: Diagnosis not present

## 2021-05-04 DIAGNOSIS — R011 Cardiac murmur, unspecified: Secondary | ICD-10-CM | POA: Diagnosis not present

## 2021-05-04 MED ORDER — FENOFIBRATE 54 MG PO TABS: 54.0000 mg | ORAL_TABLET | Freq: Every morning | ORAL | 1 refills | Status: DC

## 2021-05-04 MED ORDER — HYDROCHLOROTHIAZIDE 25 MG PO TABS: 25.0000 mg | ORAL_TABLET | Freq: Every day | ORAL | 1 refills | Status: DC

## 2021-05-04 MED ORDER — LOSARTAN POTASSIUM 50 MG PO TABS: ORAL_TABLET | ORAL | 1 refills | Status: DC

## 2021-05-04 MED ORDER — ATORVASTATIN CALCIUM 20 MG PO TABS: ORAL_TABLET | ORAL | 3 refills | Status: DC

## 2021-05-04 NOTE — Patient Instructions (Addendum)
  Great to see you today.  I have refilled the medication(s) we provide.   If labs were collected, we will inform you of lab results once received either by echart message or telephone call.   - echart message- for normal results that have been seen by the patient already.   - telephone call: abnormal results or if patient has not viewed results in their echart.  Next appt mid-march for physical and chronic conditions. We will collect labs that day- try to fast if able.

## 2021-05-04 NOTE — Progress Notes (Signed)
This visit occurred during the SARS-CoV-2 public health emergency.  Safety protocols were in place, including screening questions prior to the visit, additional usage of staff PPE, and extensive cleaning of exam room while observing appropriate contact time as indicated for disinfecting solutions.    Patient ID: Brianna Conrad, female  DOB: Feb 26, 1956, 65 y.o.   MRN: [id number] Patient Care Team    Relationship Specialty Notifications Start End  Ma Hillock, DO PCP - General Family Medicine  07/14/15   [name], MD Consulting Physician Obstetrics and Gynecology  08/16/16   [facility]    08/31/20    Comment: Dr. Terrilee Files, Luanna Cole, MD Referring Physician Gastroenterology  09/20/20     Chief Complaint  Patient presents with   Hypertension    [place]; pt is not fasting    Subjective:  Brianna Conrad is a 65 y.o.  Female  present for [facility] All recent labs, ED visits and hospitalizations within the last year were reviewed.  Hypertension/left carotid bruit/HLD/heart murmur::  Pt reports compliance   with HCTZ 25 mg QD and losartan 50 mg qd . Patient denies chest pain, shortness of breath, dizziness or lower extremity edema.  Pt does not take a daily baby ASA. Pt is  prescribed statin and fenofibrate. Diet: Low-sodium Exercise: Not routinely RF: Hypertension, hyperlipidemia, family history stroke  Depression screen [facility] 2/9 08/31/2020 05/26/2019 05/13/2018 10/16/2017 08/16/2016  Decreased Interest 0 0 0 - 0  Down, Depressed, Hopeless 0 0 0 0 0  PHQ - 2 Score 0 0 0 0 0   No flowsheet data found.   Immunization History  Administered Date(s) Administered   Influenza Inj Mdck Quad Pf 07/15/2018   Influenza,inj,Quad PF,6+ Mos 05/26/2019, 05/04/2021   Influenza-Unspecified 06/25/2013, 07/01/2014   Moderna Sars-Covid-2 Vaccination 10/24/2019, 11/25/2019   Tdap 08/18/2015   Past Medical History:  Diagnosis Date   Atypical glandular cells on Pap smear    Basal cell  carcinoma    Cancer ([place]) 2014   basal cell skin cancer   Diverticulosis    History of chicken pox    History of colon polyps    BENIGN  2013   History of squamous cell carcinoma excision    RIGHT SHOULDER   Hyperlipidemia    Hypertension    Vaginal delivery [id number], 1993   Allergies  Allergen Reactions   Amoxicillin Rash   Penicillins Rash   Past Surgical History:  Procedure Laterality Date   CERVICAL CONIZATION W/BX N/A 06/20/2013   Procedure: CONIZATION CERVIX WITH BIOPSY;  Surgeon: [name], MD;  Location: [facility];  Service: Gynecology;  Laterality: N/A;   COLONOSCOPY W/ POLYPECTOMY  09/2011   HYSTEROSCOPY WITH D & C N/A 06/20/2013   Procedure: DILATATION AND CURETTAGE /HYSTEROSCOPY;  Surgeon: [name], MD;  Location: [facility];  Service: Gynecology;  Laterality: N/A;   SKIN BIOPSY     TUBAL LIGATION  1993   POST PARTUM   Family History  Problem Relation Age of Onset   Diabetes Father    Colon cancer Brother 14   Gallbladder disease Mother    Cancer Mother    Diabetes Paternal Uncle    Diabetes Paternal Grandmother    Stroke Paternal Grandfather    Social History   Social History Narrative   Married, Tom. 3 children.   Retired Film/video editor). College degree.    Drinks caffeinated beverages.   Wears her seatbelt, exercises at least 3 times a week, smoke detectors in the  home.   No firearms in the home.   Feels safe in her relationships.    Allergies as of 05/04/2021       Reactions   Amoxicillin Rash   Penicillins Rash        Medication List        Accurate as of May 04, 2021  9:51 AM. If you have any questions, ask your nurse or doctor.          atorvastatin 20 MG tablet Commonly known as: LIPITOR TAKE 1 TABLET(10 MG) BY MOUTH in evening   fenofibrate 54 MG tablet Take 1 tablet (54 mg total) by mouth every morning.   hydrochlorothiazide 25 MG tablet Commonly known as: HYDRODIURIL Take 1 tablet (25 mg total) by mouth  daily.   losartan 50 MG tablet Commonly known as: COZAAR TAKE 1 TABLET(25 MG) BY MOUTH DAILY        All past medical history, surgical history, allergies, family history, immunizations andmedications were updated in the EMR today and reviewed under the history and medication portions of their EMR.       ROS: 14 pt review of systems performed and negative (unless mentioned in an HPI)  Objective: BP 138/72   Pulse 65   Temp 98.1 F (36.7 C) (Oral)   Ht 5\' 4"  (1.626 m)   Wt 162 lb (73.5 kg)   LMP 08/07/2015   SpO2 99%   BMI 27.81 kg/m  Gen: Afebrile. No acute distress. Nontoxic very pleasant female.  HENT: AT. [place]. MMM. No cough or hoarseness.  Eyes:Pupils Equal Round Reactive to light, Extraocular movements intact,  Conjunctiva without redness, discharge or icterus. Neck/lymp/endocrine: Supple,no lymphadenopathy, no thyromegaly CV: RRR 1/6 SM, no edema Chest: CTAB, no wheeze or crackles Skin: no rashes, purpura or petechiae.  Neuro:  Normal gait. PERLA. EOMi. Alert. Oriented x3 Psych: Normal affect, dress and demeanor. Normal speech. Normal thought content and judgment..   No results found.  Assessment/plan: Brianna Conrad is a 65 y.o. female present for [facility] Essential hypertension, benign Hyperlipidemia LDL goal <100 Left carotid bruit Heart murmur Stable.  -  continue  losartan 50 mg QD - continue  HCTZ to 25 mg.  - continue  Fenofibrate - continue statin - Encouraged baby aspirin, if not taking NSAIDS routinely.  - Low-sodium diet, routine exercise encouraged. - Heart murmur present since childhood - Carotid Doppler studies completed January 2018, minimal heterogeneous plaque, with no hemodynamically significant stenosis by duplex criteria and extracranial cerebrovascular circulation. - Follow-up in 5.5 months   Return in about 5 months (around 10/18/2021) for CPE (30 min), CMC (30 min).   Orders Placed This Encounter  Procedures   Flu Vaccine QUAD 6+ mos PF  IM (Fluarix Quad PF)   Meds ordered this encounter  Medications   hydrochlorothiazide (HYDRODIURIL) 25 MG tablet    Sig: Take 1 tablet (25 mg total) by mouth daily.    Dispense:  90 tablet    Refill:  1   losartan (COZAAR) 50 MG tablet    Sig: TAKE 1 TABLET(25 MG) BY MOUTH DAILY    Dispense:  90 tablet    Refill:  1   fenofibrate 54 MG tablet    Sig: Take 1 tablet (54 mg total) by mouth every morning.    Dispense:  90 tablet    Refill:  1   atorvastatin (LIPITOR) 20 MG tablet    Sig: TAKE 1 TABLET(10 MG) BY MOUTH in evening    Dispense:  90  tablet    Refill:  3    Referral Orders  No referral(s) requested today    Electronically signed by: Howard Pouch, [place]

## 2021-05-10 ENCOUNTER — Ambulatory Visit: Payer: BC Managed Care – PPO | Admitting: Family Medicine

## 2021-10-17 DIAGNOSIS — Z6828 Body mass index (BMI) 28.0-28.9, adult: Secondary | ICD-10-CM | POA: Diagnosis not present

## 2021-10-17 DIAGNOSIS — Z1231 Encounter for screening mammogram for malignant neoplasm of breast: Secondary | ICD-10-CM | POA: Diagnosis not present

## 2021-10-17 DIAGNOSIS — Z124 Encounter for screening for malignant neoplasm of cervix: Secondary | ICD-10-CM | POA: Diagnosis not present

## 2021-10-17 DIAGNOSIS — Z1151 Encounter for screening for human papillomavirus (HPV): Secondary | ICD-10-CM | POA: Diagnosis not present

## 2021-10-17 DIAGNOSIS — Z01419 Encounter for gynecological examination (general) (routine) without abnormal findings: Secondary | ICD-10-CM | POA: Diagnosis not present

## 2021-10-17 LAB — HM MAMMOGRAPHY

## 2021-10-17 LAB — HM PAP SMEAR

## 2021-10-17 LAB — RESULTS CONSOLE HPV: CHL HPV: NEGATIVE

## 2021-10-24 ENCOUNTER — Other Ambulatory Visit: Payer: Self-pay

## 2021-10-25 ENCOUNTER — Encounter: Payer: Self-pay | Admitting: Family Medicine

## 2021-10-25 ENCOUNTER — Ambulatory Visit (INDEPENDENT_AMBULATORY_CARE_PROVIDER_SITE_OTHER): Payer: BC Managed Care – PPO | Admitting: Family Medicine

## 2021-10-25 VITALS — BP 135/68 | HR 72 | Temp 98.0°F | Ht 64.0 in | Wt 169.0 lb

## 2021-10-25 DIAGNOSIS — Z79899 Other long term (current) drug therapy: Secondary | ICD-10-CM

## 2021-10-25 DIAGNOSIS — Z1231 Encounter for screening mammogram for malignant neoplasm of breast: Secondary | ICD-10-CM | POA: Diagnosis not present

## 2021-10-25 DIAGNOSIS — Z0000 Encounter for general adult medical examination without abnormal findings: Secondary | ICD-10-CM | POA: Diagnosis not present

## 2021-10-25 DIAGNOSIS — E663 Overweight: Secondary | ICD-10-CM

## 2021-10-25 DIAGNOSIS — E785 Hyperlipidemia, unspecified: Secondary | ICD-10-CM

## 2021-10-25 DIAGNOSIS — Z23 Encounter for immunization: Secondary | ICD-10-CM | POA: Diagnosis not present

## 2021-10-25 DIAGNOSIS — Z5181 Encounter for therapeutic drug level monitoring: Secondary | ICD-10-CM

## 2021-10-25 DIAGNOSIS — I10 Essential (primary) hypertension: Secondary | ICD-10-CM | POA: Diagnosis not present

## 2021-10-25 LAB — LIPID PANEL
Cholesterol: 154 mg/dL (ref 0–200)
HDL: 43.1 mg/dL (ref >39.00)
NonHDL: 111.25
Total CHOL/HDL Ratio: 4
Triglycerides: 234 mg/dL — ABNORMAL HIGH (ref 0.0–149.0)
VLDL: 46.8 mg/dL — ABNORMAL HIGH (ref 0.0–40.0)

## 2021-10-25 LAB — CBC
HCT: 39.7 % (ref 36.0–46.0)
Hemoglobin: 13.6 g/dL (ref 12.0–15.0)
MCHC: 34.2 g/dL (ref 30.0–36.0)
MCV: 92.7 fl (ref 78.0–100.0)
Platelets: 184 10*3/uL (ref 150.0–400.0)
RBC: 4.29 Mil/uL (ref 3.87–5.11)
RDW: 12.3 % (ref 11.5–15.5)
WBC: 5.6 10*3/uL (ref 4.0–10.5)

## 2021-10-25 LAB — COMPREHENSIVE METABOLIC PANEL
ALT: 29 U/L (ref 0–35)
AST: 18 U/L (ref 0–37)
Albumin: 4.7 g/dL (ref 3.5–5.2)
Alkaline Phosphatase: 61 U/L (ref 39–117)
BUN: 21 mg/dL (ref 6–23)
CO2: 30 mEq/L (ref 19–32)
Calcium: 9.7 mg/dL (ref 8.4–10.5)
Chloride: 100 mEq/L (ref 96–112)
Creatinine, Ser: 0.86 mg/dL (ref 0.40–1.20)
GFR: 71.33 mL/min (ref >60.00)
Glucose, Bld: 101 mg/dL — ABNORMAL HIGH (ref 70–99)
Potassium: 3.7 mEq/L (ref 3.5–5.1)
Sodium: 139 mEq/L (ref 135–145)
Total Bilirubin: 1.2 mg/dL (ref 0.2–1.2)
Total Protein: 6.8 g/dL (ref 6.0–8.3)

## 2021-10-25 LAB — HEMOGLOBIN A1C: Hgb A1c MFr Bld: 5.6 % (ref 4.6–6.5)

## 2021-10-25 LAB — LDL CHOLESTEROL, DIRECT: Direct LDL: 74 mg/dL

## 2021-10-25 LAB — TSH: TSH: 2.12 u[IU]/mL (ref 0.35–5.50)

## 2021-10-25 MED ORDER — ATORVASTATIN CALCIUM 20 MG PO TABS: ORAL_TABLET | ORAL | 3 refills | Status: DC

## 2021-10-25 MED ORDER — HYDROCHLOROTHIAZIDE 25 MG PO TABS: 25.0000 mg | ORAL_TABLET | Freq: Every day | ORAL | 1 refills | Status: DC

## 2021-10-25 MED ORDER — LOSARTAN POTASSIUM 50 MG PO TABS: ORAL_TABLET | ORAL | 1 refills | Status: DC

## 2021-10-25 MED ORDER — FENOFIBRATE 54 MG PO TABS: 54.0000 mg | ORAL_TABLET | Freq: Every morning | ORAL | 4 refills | Status: DC

## 2021-10-25 NOTE — Progress Notes (Signed)
? ?This visit occurred during the SARS-CoV-2 public health emergency.  Safety protocols were in place, including screening questions prior to the visit, additional usage of staff PPE, and extensive cleaning of exam room while observing appropriate contact time as indicated for disinfecting solutions.  ? ? ?Patient ID: Brianna Conrad, female  DOB: January 08, 1956, 66 y.o.   MRN: [id number] ?Patient Care Team  ?  Relationship Specialty Notifications Start End  ?Ma Hillock, DO PCP - General Family Medicine  07/14/15   ?[name], MD Consulting Physician Obstetrics and Gynecology  08/16/16   ?[facility]    08/31/20   ? Comment: Dr. Dede Query  ?Rosana Berger, MD Referring Physician Gastroenterology  09/20/20   ? ? ?Chief Complaint  ?Patient presents with  ? Annual Exam  ?  And [place]; pt is fasting  ? ? ?Subjective: ?Brianna Conrad is a 66 y.o.  Female  present for CPE/CMC ?All past medical history, surgical history, allergies, family history, immunizations, medications and social history were updated in the electronic medical record today. ?All recent labs, ED visits and hospitalizations within the last year were reviewed. ? ?Health maintenance:  ?Colonoscopy: fhx brother 73, personal history of colon polyps. Last colonoscopy 2022, 5 years recall. [facility]- Dr. Glennon Hamilton  ?Mammogram: No fhx, UTD Dr. [name] requested ?Cervical cancer screening: Last Pap smear 2019.  Dr. [name].  ?Immunizations: Tdap UTD 2017. Flu UTD 2022, covid utd, shingrix after beach trip by nurse appt.  ?Infectious disease screening: HIV and hepatitis C completed ?Dexa at GYN ?Assistive device: none ?Assistive device: none ?Oxygen YDX:AJOI ?Patient has a Dental home. ?Hospitalizations/ED visits: reviewed ? ?Hypertension/left carotid bruit/HLD/heart murmur::  ?Pt reports compliance   with HCTZ 25 mg QD and losartan 50 mg qd .Patient denies chest pain, shortness of breath, dizziness or lower extremity edema.   ?Pt does not take a daily baby ASA. Pt  is  prescribed statin and fenofibrate. ?Diet: Low-sodium ?Exercise: Not routinely ?RF: Hypertension, hyperlipidemia, family history stroke ? ?Depression screen [facility] 2/9 10/25/2021 08/31/2020 05/26/2019 05/13/2018 10/16/2017  ?Decreased Interest 0 0 0 0 -  ?Down, Depressed, Hopeless 0 0 0 0 0  ?PHQ - 2 Score 0 0 0 0 0  ? ?No flowsheet data found. ? ?Immunization History  ?Administered Date(s) Administered  ? Influenza Inj Mdck Quad Pf 07/15/2018  ? Influenza,inj,Quad PF,6+ Mos 05/26/2019, 05/04/2021  ? Influenza-Unspecified 06/25/2013, 07/01/2014  ? Moderna Sars-Covid-2 Vaccination 10/24/2019, 11/25/2019  ? Tdap 08/18/2015  ? ? ?Past Medical History:  ?Diagnosis Date  ? Atypical glandular cells on Pap smear   ? Basal cell carcinoma   ? Cancer [facility]) 2014  ? basal cell skin cancer  ? Diverticulosis   ? History of chicken pox   ? History of colon polyps   ? BENIGN  2013  ? History of squamous cell carcinoma excision   ? RIGHT SHOULDER  ? Hyperlipidemia   ? Hypertension   ? [facility] spotted fever) 08/21/2016  ? Vaginal delivery 7867,6720, 1993  ? ?Allergies  ?Allergen Reactions  ? Amoxicillin Rash  ? Penicillins Rash  ? ?Past Surgical History:  ?Procedure Laterality Date  ? CERVICAL CONIZATION W/BX N/A 06/20/2013  ? Procedure: CONIZATION CERVIX WITH BIOPSY;  Surgeon: [name], MD;  Location: [place] [facility];  Service: Gynecology;  Laterality: N/A;  ? COLONOSCOPY W/ POLYPECTOMY  09/2011  ? HYSTEROSCOPY WITH D & C N/A 06/20/2013  ? Procedure: DILATATION AND CURETTAGE /HYSTEROSCOPY;  Surgeon: [name], MD;  Location: [facility];  Service: Gynecology;  Laterality: N/A;  ? SKIN BIOPSY    ? TUBAL LIGATION  1993  ? POST PARTUM  ? ?Family History  ?Problem Relation Age of Onset  ? Diabetes Father   ? Colon cancer Brother 40  ? Gallbladder disease Mother   ? Cancer Mother   ? Diabetes Paternal Uncle   ? Diabetes Paternal Grandmother   ? Stroke Paternal Grandfather   ? ?Social History  ? ?Social History Narrative  ? Married,  Tom. 3 children.  ? Retired Film/video editor). College degree.   ? Drinks caffeinated beverages.  ? Wears her seatbelt, exercises at least 3 times a week, smoke detectors in the home.  ? No firearms in the home.  ? Feels safe in her relationships.  ? ? ?Allergies as of 10/25/2021   ? ?   Reactions  ? Amoxicillin Rash  ? Penicillins Rash  ? ?  ? ?  ?Medication List  ?  ? ?  ? Accurate as of October 25, 2021  8:26 AM. If you have any questions, ask your nurse or doctor.  ?  ?  ? ?  ? ?atorvastatin 20 MG tablet ?Commonly known as: LIPITOR ?TAKE 1 TABLET(10 MG) BY MOUTH in evening ?  ?fenofibrate 54 MG tablet ?Take 1 tablet (54 mg total) by mouth every morning. ?  ?hydrochlorothiazide 25 MG tablet ?Commonly known as: HYDRODIURIL ?Take 1 tablet (25 mg total) by mouth daily. ?  ?losartan 50 MG tablet ?Commonly known as: COZAAR ?TAKE 1 TABLET(25 MG) BY MOUTH DAILY ?  ? ?  ? ? ?All past medical history, surgical history, allergies, family history, immunizations andmedications were updated in the EMR today and reviewed under the history and medication portions of their EMR.    ? ?No results found for this or any previous visit (from the past 2160 hour(s)). ? ? ?ROS ?14 pt review of systems performed and negative (unless mentioned in an HPI) ? ?Objective: ?BP 135/68   Pulse 72   Temp 98 ?F (36.7 ?C) (Oral)   Ht '5\' 4"'$  (1.626 m)   Wt 169 lb (76.7 kg)   LMP 08/07/2015   SpO2 99%   BMI 29.01 kg/m?  ?Physical Exam ?Vitals and nursing note reviewed.  ?Constitutional:   ?   General: She is not in acute distress. ?   Appearance: Normal appearance. She is not ill-appearing or toxic-appearing.  ?HENT:  ?   Head: Normocephalic and atraumatic.  ?   Right Ear: Tympanic membrane, ear canal and external ear normal. There is no impacted cerumen.  ?   Left Ear: Tympanic membrane, ear canal and external ear normal. There is no impacted cerumen.  ?   Nose: No congestion or rhinorrhea.  ?   Mouth/Throat:  ?   Mouth: Mucous membranes are moist.   ?   Pharynx: Oropharynx is clear. No oropharyngeal exudate or posterior oropharyngeal erythema.  ?Eyes:  ?   General: No scleral icterus.    ?   Right eye: No discharge.     ?   Left eye: No discharge.  ?   Extraocular Movements: Extraocular movements intact.  ?   Conjunctiva/sclera: Conjunctivae normal.  ?   Pupils: Pupils are equal, round, and reactive to light.  ?Cardiovascular:  ?   Rate and Rhythm: Normal rate and regular rhythm.  ?   Pulses: Normal pulses.  ?   Heart sounds: Normal heart sounds. No murmur heard. ?  No friction rub. No gallop.  ?Pulmonary:  ?  Effort: Pulmonary effort is normal. No respiratory distress.  ?   Breath sounds: Normal breath sounds. No stridor. No wheezing, rhonchi or rales.  ?Chest:  ?   Chest wall: No tenderness.  ?Abdominal:  ?   General: Abdomen is flat. Bowel sounds are normal. There is no distension.  ?   Palpations: Abdomen is soft. There is no mass.  ?   Tenderness: There is no abdominal tenderness. There is no right CVA tenderness, left CVA tenderness, guarding or rebound.  ?   Hernia: No hernia is present.  ?Musculoskeletal:     ?   General: No swelling, tenderness or deformity. Normal range of motion.  ?   Cervical back: Normal range of motion and neck supple. No rigidity or tenderness.  ?   Right lower leg: No edema.  ?   Left lower leg: No edema.  ?Lymphadenopathy:  ?   Cervical: No cervical adenopathy.  ?Skin: ?   General: Skin is warm and dry.  ?   Coloration: Skin is not jaundiced or pale.  ?   Findings: No bruising, erythema, lesion or rash.  ?Neurological:  ?   General: No focal deficit present.  ?   Mental Status: She is alert and oriented to person, place, and time. Mental status is at baseline.  ?   Cranial Nerves: No cranial nerve deficit.  ?   Sensory: No sensory deficit.  ?   Motor: No weakness.  ?   Coordination: Coordination normal.  ?   Gait: Gait normal.  ?   Deep Tendon Reflexes: Reflexes normal.  ?Psychiatric:     ?   Mood and Affect: Mood normal.      ?   Behavior: Behavior normal.     ?   Thought Content: Thought content normal.     ?   Judgment: Judgment normal.  ?  ? ?No results found. ? ?Assessment/plan: ?Coralyn Roselli is a 66 y.o. female present f

## 2021-10-25 NOTE — Patient Instructions (Signed)

## 2021-11-24 DIAGNOSIS — Z789 Other specified health status: Secondary | ICD-10-CM | POA: Diagnosis not present

## 2021-11-24 DIAGNOSIS — L820 Inflamed seborrheic keratosis: Secondary | ICD-10-CM | POA: Diagnosis not present

## 2021-11-24 DIAGNOSIS — D1801 Hemangioma of skin and subcutaneous tissue: Secondary | ICD-10-CM | POA: Diagnosis not present

## 2021-11-24 DIAGNOSIS — L570 Actinic keratosis: Secondary | ICD-10-CM | POA: Diagnosis not present

## 2022-04-17 ENCOUNTER — Ambulatory Visit: Payer: BC Managed Care – PPO | Admitting: Family Medicine

## 2022-04-17 ENCOUNTER — Encounter: Payer: Self-pay | Admitting: Family Medicine

## 2022-04-17 VITALS — BP 137/70 | HR 58 | Temp 98.1°F | Ht 64.0 in | Wt 157.0 lb

## 2022-04-17 DIAGNOSIS — R0989 Other specified symptoms and signs involving the circulatory and respiratory systems: Secondary | ICD-10-CM

## 2022-04-17 DIAGNOSIS — Z5181 Encounter for therapeutic drug level monitoring: Secondary | ICD-10-CM

## 2022-04-17 DIAGNOSIS — E663 Overweight: Secondary | ICD-10-CM | POA: Diagnosis not present

## 2022-04-17 DIAGNOSIS — Z23 Encounter for immunization: Secondary | ICD-10-CM | POA: Diagnosis not present

## 2022-04-17 DIAGNOSIS — I10 Essential (primary) hypertension: Secondary | ICD-10-CM | POA: Diagnosis not present

## 2022-04-17 DIAGNOSIS — E785 Hyperlipidemia, unspecified: Secondary | ICD-10-CM

## 2022-04-17 DIAGNOSIS — Z79899 Other long term (current) drug therapy: Secondary | ICD-10-CM

## 2022-04-17 MED ORDER — LOSARTAN POTASSIUM 50 MG PO TABS: ORAL_TABLET | ORAL | 1 refills | Status: DC

## 2022-04-17 MED ORDER — FENOFIBRATE 54 MG PO TABS: 54.0000 mg | ORAL_TABLET | Freq: Every morning | ORAL | 1 refills | Status: DC

## 2022-04-17 MED ORDER — ATORVASTATIN CALCIUM 20 MG PO TABS: ORAL_TABLET | ORAL | 1 refills | Status: DC

## 2022-04-17 MED ORDER — HYDROCHLOROTHIAZIDE 25 MG PO TABS: 25.0000 mg | ORAL_TABLET | Freq: Every day | ORAL | 1 refills | Status: DC

## 2022-04-17 NOTE — Patient Instructions (Signed)
Return in about 6 months (around 10/26/2022) for cpe (20 min), Routine chronic condition follow-up.        Great to see you today.  I have refilled the medication(s) we provide.   If labs were collected, we will inform you of lab results once received either by echart message or telephone call.   - echart message- for normal results that have been seen by the patient already.   - telephone call: abnormal results or if patient has not viewed results in their echart.

## 2022-04-17 NOTE — Progress Notes (Signed)
Patient ID: Brianna Conrad, female  DOB: [birth year], 66 y.o.   MRN: [id number] Patient Care Team    Relationship Specialty Notifications Start End  Ma Hillock, DO PCP - General Family Medicine  07/14/15   [name], MD Consulting Physician Obstetrics and Gynecology  08/16/16   [facility]    08/31/20    Comment: Dr. Terrilee Files, Luanna Cole, MD Referring Physician Gastroenterology  09/20/20     Chief Complaint  Patient presents with   Hypertension    [facility]; pt is fasting    Subjective: Brianna Conrad is a 66 y.o.  Female  present for [facility] All past medical history, surgical history, allergies, family history, immunizations, medications and social history were updated in the electronic medical record today. All recent labs, ED visits and hospitalizations within the last year were reviewed.  Hypertension/left carotid bruit/HLD/heart murmur::  Pt reports compliance with HCTZ 25 mg QD and losartan 50 mg qd . Patient denies chest pain, shortness of breath, dizziness or lower extremity edema.  Pt does take a daily baby ASA. Pt is  prescribed statin and fenofibrate. Diet: Low-sodium Exercise: Not routinely RF: Hypertension, hyperlipidemia, family history stroke     10/25/2021    8:07 AM 08/31/2020   10:02 AM 05/26/2019    8:54 AM 05/13/2018   10:26 AM 10/16/2017    9:54 AM  Depression screen PHQ 2/9  Decreased Interest 0 0 0 0   Down, Depressed, Hopeless 0 0 0 0 0  PHQ - 2 Score 0 0 0 0 0       No data to display          Immunization History  Administered Date(s) Administered   Influenza Inj Mdck Quad Pf 07/15/2018   Influenza,inj,Quad PF,6+ Mos 05/26/2019, 05/04/2021   Influenza-Unspecified 06/25/2013, 07/01/2014   Moderna Sars-Covid-2 Vaccination 10/24/2019, 11/25/2019   PNEUMOCOCCAL CONJUGATE-20 04/17/2022   Tdap 08/18/2015   Zoster Recombinat (Shingrix) 04/17/2022    Past Medical History:  Diagnosis Date   Atypical glandular cells on Pap smear     Basal cell carcinoma    Cancer ([place]) 2014   basal cell skin cancer   Diverticulosis    History of chicken pox    History of colon polyps    BENIGN  2013   History of squamous cell carcinoma excision    RIGHT SHOULDER   Hyperlipidemia    Hypertension    R[facility] spotted fever) 08/21/2016   Vaginal delivery 7829,5621, 1993   Allergies  Allergen Reactions   Amoxicillin Rash   Penicillins Rash   Past Surgical History:  Procedure Laterality Date   CERVICAL CONIZATION W/BX N/A 06/20/2013   Procedure: CONIZATION CERVIX WITH BIOPSY;  Surgeon: [name], MD;  Location: [place] [facility];  Service: Gynecology;  Laterality: N/A;   COLONOSCOPY W/ POLYPECTOMY  09/2011   HYSTEROSCOPY WITH D & C N/A 06/20/2013   Procedure: DILATATION AND CURETTAGE /HYSTEROSCOPY;  Surgeon: [name], MD;  Location: [place] [facility];  Service: Gynecology;  Laterality: N/A;   SKIN BIOPSY     TUBAL LIGATION  1993   POST PARTUM   Family History  Problem Relation Age of Onset   Diabetes Father    Colon cancer Brother 65   Gallbladder disease Mother    Cancer Mother    Diabetes Paternal Uncle    Diabetes Paternal Grandmother    Stroke Paternal Grandfather    Social History   Social History Narrative   Married,  Tom. 3 children.   Retired Film/video editor). College degree.    Drinks caffeinated beverages.   Wears her seatbelt, exercises at least 3 times a week, smoke detectors in the home.   No firearms in the home.   Feels safe in her relationships.    Allergies as of 04/17/2022       Reactions   Amoxicillin Rash   Penicillins Rash        Medication List        Accurate as of April 17, 2022  8:27 AM. If you have any questions, ask your nurse or doctor.          atorvastatin 20 MG tablet Commonly known as: LIPITOR TAKE 1 TABLET(10 MG) BY MOUTH in evening   fenofibrate 54 MG tablet Take 1 tablet (54 mg total) by mouth every morning.   hydrochlorothiazide 25 MG tablet Commonly  known as: HYDRODIURIL Take 1 tablet (25 mg total) by mouth daily.   losartan 50 MG tablet Commonly known as: COZAAR TAKE 1 TABLET(25 MG) BY MOUTH DAILY        All past medical history, surgical history, allergies, family history, immunizations andmedications were updated in the EMR today and reviewed under the history and medication portions of their EMR.     No results found for this or any previous visit (from the past 2160 hour(s)).   ROS 14 pt review of systems performed and negative (unless mentioned in an HPI)  Objective: BP 137/70   Pulse (!) 58   Temp 98.1 F (36.7 C) (Oral)   Ht '5\' 4"'$  (1.626 m)   Wt 157 lb (71.2 kg)   LMP 08/07/2015   SpO2 100%   BMI 26.95 kg/m  Physical Exam Vitals and nursing note reviewed.  Constitutional:      General: She is not in acute distress.    Appearance: Normal appearance. She is not ill-appearing, toxic-appearing or diaphoretic.  HENT:     Head: Normocephalic and atraumatic.     Mouth/Throat:     Mouth: Mucous membranes are moist.  Eyes:     General: No scleral icterus.       Right eye: No discharge.        Left eye: No discharge.     Extraocular Movements: Extraocular movements intact.     Conjunctiva/sclera: Conjunctivae normal.     Pupils: Pupils are equal, round, and reactive to light.  Cardiovascular:     Rate and Rhythm: Normal rate and regular rhythm.  Pulmonary:     Effort: Pulmonary effort is normal. No respiratory distress.     Breath sounds: Normal breath sounds. No wheezing, rhonchi or rales.  Musculoskeletal:     Cervical back: Neck supple. No tenderness.     Right lower leg: No edema.     Left lower leg: No edema.  Lymphadenopathy:     Cervical: No cervical adenopathy.  Skin:    General: Skin is warm and dry.     Coloration: Skin is not jaundiced or pale.     Findings: No erythema or rash.  Neurological:     Mental Status: She is alert and oriented to person, place, and time. Mental status is at  baseline.     Motor: No weakness.     Gait: Gait normal.  Psychiatric:        Mood and Affect: Mood normal.        Behavior: Behavior normal.        Thought Content: Thought content  normal.        Judgment: Judgment normal.      No results found.  Assessment/plan: Brianna Conrad is a 66 y.o. female present for CPE/CMC Essential hypertension, benign Hyperlipidemia LDL goal <100 Left carotid bruit Heart murmur Stable Continue losartan 50 mg QD Continue HCTZ to 25 mg.  Continue fenofibrate Continue statin - Encouraged baby aspirin, if not taking NSAIDS routinely.  - Low-sodium diet, routine exercise encouraged. - Heart murmur present since childhood - Carotid Doppler studies completed January 2018, minimal  heterogeneous plaque, with no hemodynamically significant stenosis by duplex criteria and extracranial cerebrovascular circulation. - Follow-up in 5.5 months Need for pneumococcal 20-valent conjugate vaccination - Pneumococcal conjugate vaccine 20-valent (Prevnar 20) Need for zoster vaccination - Varicella-zoster vaccine IM  Return in about 6 months (around 10/26/2022) for cpe (20 min), Routine chronic condition follow-up.   Orders Placed This Encounter  Procedures   Pneumococcal conjugate vaccine 20-valent (Prevnar 20)   Varicella-zoster vaccine IM   Meds ordered this encounter  Medications   atorvastatin (LIPITOR) 20 MG tablet    Sig: TAKE 1 TABLET(10 MG) BY MOUTH in evening    Dispense:  90 tablet    Refill:  1   fenofibrate 54 MG tablet    Sig: Take 1 tablet (54 mg total) by mouth every morning.    Dispense:  90 tablet    Refill:  1   hydrochlorothiazide (HYDRODIURIL) 25 MG tablet    Sig: Take 1 tablet (25 mg total) by mouth daily.    Dispense:  90 tablet    Refill:  1   losartan (COZAAR) 50 MG tablet    Sig: TAKE 1 TABLET(25 MG) BY MOUTH DAILY    Dispense:  90 tablet    Refill:  1   Referral Orders  No referral(s) requested today      Electronically signed by: Howard Pouch, [place]

## 2022-10-23 ENCOUNTER — Ambulatory Visit (INDEPENDENT_AMBULATORY_CARE_PROVIDER_SITE_OTHER): Payer: BC Managed Care – PPO | Admitting: Family Medicine

## 2022-10-23 ENCOUNTER — Encounter: Payer: Self-pay | Admitting: Family Medicine

## 2022-10-23 VITALS — BP 127/67 | HR 62 | Temp 98.1°F | Ht 64.0 in | Wt 150.8 lb

## 2022-10-23 DIAGNOSIS — I10 Essential (primary) hypertension: Secondary | ICD-10-CM

## 2022-10-23 DIAGNOSIS — Z23 Encounter for immunization: Secondary | ICD-10-CM

## 2022-10-23 DIAGNOSIS — Z0000 Encounter for general adult medical examination without abnormal findings: Secondary | ICD-10-CM

## 2022-10-23 DIAGNOSIS — E785 Hyperlipidemia, unspecified: Secondary | ICD-10-CM | POA: Diagnosis not present

## 2022-10-23 DIAGNOSIS — Z79899 Other long term (current) drug therapy: Secondary | ICD-10-CM

## 2022-10-23 DIAGNOSIS — Z5181 Encounter for therapeutic drug level monitoring: Secondary | ICD-10-CM | POA: Diagnosis not present

## 2022-10-23 LAB — CBC
HCT: 40.6 % (ref 36.0–46.0)
Hemoglobin: 13.8 g/dL (ref 12.0–15.0)
MCHC: 34.1 g/dL (ref 30.0–36.0)
MCV: 93.2 fl (ref 78.0–100.0)
Platelets: 195 10*3/uL (ref 150.0–400.0)
RBC: 4.35 Mil/uL (ref 3.87–5.11)
RDW: 12.9 % (ref 11.5–15.5)
WBC: 5.9 10*3/uL (ref 4.0–10.5)

## 2022-10-23 LAB — COMPREHENSIVE METABOLIC PANEL
ALT: 16 U/L (ref 0–35)
AST: 15 U/L (ref 0–37)
Albumin: 4.4 g/dL (ref 3.5–5.2)
Alkaline Phosphatase: 58 U/L (ref 39–117)
BUN: 21 mg/dL (ref 6–23)
CO2: 31 mEq/L (ref 19–32)
Calcium: 9.7 mg/dL (ref 8.4–10.5)
Chloride: 100 mEq/L (ref 96–112)
Creatinine, Ser: 0.83 mg/dL (ref 0.40–1.20)
GFR: 73.92 mL/min (ref >60.00)
Glucose, Bld: 105 mg/dL — ABNORMAL HIGH (ref 70–99)
Potassium: 3.5 mEq/L (ref 3.5–5.1)
Sodium: 140 mEq/L (ref 135–145)
Total Bilirubin: 1.2 mg/dL (ref 0.2–1.2)
Total Protein: 6.7 g/dL (ref 6.0–8.3)

## 2022-10-23 LAB — TSH: TSH: 2.21 u[IU]/mL (ref 0.35–5.50)

## 2022-10-23 LAB — HEMOGLOBIN A1C: Hgb A1c MFr Bld: 5.5 % (ref 4.6–6.5)

## 2022-10-23 LAB — LIPID PANEL
Cholesterol: 154 mg/dL (ref 0–200)
HDL: 55.7 mg/dL (ref >39.00)
LDL Cholesterol: 77 mg/dL (ref 0–99)
NonHDL: 98.63
Total CHOL/HDL Ratio: 3
Triglycerides: 106 mg/dL (ref 0.0–149.0)
VLDL: 21.2 mg/dL (ref 0.0–40.0)

## 2022-10-23 MED ORDER — ATORVASTATIN CALCIUM 20 MG PO TABS: ORAL_TABLET | ORAL | 1 refills | Status: DC

## 2022-10-23 MED ORDER — HYDROCHLOROTHIAZIDE 25 MG PO TABS: 25.0000 mg | ORAL_TABLET | Freq: Every day | ORAL | 1 refills | Status: DC

## 2022-10-23 MED ORDER — LOSARTAN POTASSIUM 50 MG PO TABS: ORAL_TABLET | ORAL | 1 refills | Status: DC

## 2022-10-23 MED ORDER — FENOFIBRATE 54 MG PO TABS: 54.0000 mg | ORAL_TABLET | Freq: Every morning | ORAL | 1 refills | Status: DC

## 2022-10-23 NOTE — Progress Notes (Signed)
Patient ID: Brianna Conrad, female  DOB: 1956-01-22, 66 y.o.   MRN: [id number] Patient Care Team    Relationship Specialty Notifications Start End  Ma Hillock, DO PCP - General Family Medicine  07/14/15   [name], MD Consulting Physician Obstetrics and Gynecology  08/16/16   [facility]    08/31/20    Comment: Dr. Terrilee Files, Luanna Cole, MD Referring Physician Gastroenterology  09/20/20     Chief Complaint  Patient presents with   Annual Exam    [facility]; pt is fasting    Subjective: Brianna Conrad is a 67 y.o.  Female  present for CPE and Chronic Conditions/illness Management  All past medical history, surgical history, allergies, family history, immunizations, medications and social history were updated in the electronic medical record today. All recent labs, ED visits and hospitalizations within the last year were reviewed.  Health maintenance:  Colonoscopy: fhx brother 63, personal history of colon polyps. Last colonoscopy 2022, 5 years recall. [facility]- Dr. Glennon Hamilton  Mammogram: No fhx, UTD 2023/Dr. Adkins> requested Cervical cancer screening: Last Pap smear 2023.  Dr. [name].  Immunizations: Tdap UTD 2017. Flu encouraged yearly, covid utd, shingrix #2 completed today. Infectious disease screening: HIV and hepatitis C completed Dexa at GYN Assistive device: none Assistive device: none Oxygen [id number] Patient has a Dental home. Hospitalizations/ED visits: reviewed  Hypertension/left carotid bruit/HLD/heart murmur::  Pt reports compliance with HCTZ 25 mg QD and losartan 50 mg qd  Patient denies chest pain, shortness of breath, dizziness or lower extremity edema.    Pt does not take a daily baby ASA. Pt is  prescribed statin and fenofibrate. Diet: Low-sodium Exercise: Not routinely RF: Hypertension, hyperlipidemia, family history stroke     10/23/2022    8:23 AM 10/25/2021    8:07 AM 08/31/2020   10:02 AM 05/26/2019    8:54 AM 05/13/2018   10:26 AM   Depression screen PHQ 2/9  Decreased Interest 0 0 0 0 0  Down, Depressed, Hopeless 0 0 0 0 0  PHQ - 2 Score 0 0 0 0 0      10/23/2022    8:23 AM  GAD 7 : Generalized Anxiety Score  Nervous, Anxious, on Edge 0  Control/stop worrying 0  Worry too much - different things 0  Trouble relaxing 0  Restless 0  Easily annoyed or irritable 0  Afraid - awful might happen 0  Total GAD 7 Score 0    Immunization History  Administered Date(s) Administered   Influenza Inj Mdck Quad Pf 07/15/2018   Influenza,inj,Quad PF,6+ Mos 05/26/2019, 05/04/2021   Influenza-Unspecified 06/25/2013, 07/01/2014   Moderna Sars-Covid-2 Vaccination 10/24/2019, 11/25/2019   PNEUMOCOCCAL CONJUGATE-20 04/17/2022   Tdap 08/18/2015   Zoster Recombinat (Shingrix) 04/17/2022, 10/23/2022    Past Medical History:  Diagnosis Date   Atypical glandular cells on Pap smear    Basal cell carcinoma    Cancer ([place]) 2014   basal cell skin cancer   Diverticulosis    History of chicken pox    History of colon polyps    BENIGN  2013   History of squamous cell carcinoma excision    RIGHT SHOULDER   Hyperlipidemia    Hypertension    RMSF [facility] spotted fever) 08/21/2016   Vaginal delivery [id number], 1993   Allergies  Allergen Reactions   Amoxicillin Rash   Penicillins Rash   Past Surgical History:  Procedure Laterality Date   CERVICAL CONIZATION W/BX N/A 06/20/2013  Procedure: CONIZATION CERVIX WITH BIOPSY;  Surgeon: [name], MD;  Location: [place] [facility];  Service: Gynecology;  Laterality: N/A;   COLONOSCOPY W/ POLYPECTOMY  09/2011   HYSTEROSCOPY WITH D & C N/A 06/20/2013   Procedure: DILATATION AND CURETTAGE /HYSTEROSCOPY;  Surgeon: [name], MD;  Location: [place] [facility];  Service: Gynecology;  Laterality: N/A;   SKIN BIOPSY     TUBAL LIGATION  1993   POST PARTUM   Family History  Problem Relation Age of Onset   Diabetes Father    Colon cancer Brother 62   Gallbladder disease Mother    Cancer  Mother    Diabetes Paternal Uncle    Diabetes Paternal Grandmother    Stroke Paternal Grandfather    Social History   Social History Narrative   Married, Tom. 3 children.   Retired Film/video editor). College degree.    Drinks caffeinated beverages.   Wears her seatbelt, exercises at least 3 times a week, smoke detectors in the home.   No firearms in the home.   Feels safe in her relationships.    Allergies as of 10/23/2022       Reactions   Amoxicillin Rash   Penicillins Rash        Medication List        Accurate as of October 23, 2022  8:33 AM. If you have any questions, ask your nurse or doctor.          atorvastatin 20 MG tablet Commonly known as: LIPITOR TAKE 1 TABLET(10 MG) BY MOUTH in evening   fenofibrate 54 MG tablet Take 1 tablet (54 mg total) by mouth every morning.   hydrochlorothiazide 25 MG tablet Commonly known as: HYDRODIURIL Take 1 tablet (25 mg total) by mouth daily.   losartan 50 MG tablet Commonly known as: COZAAR TAKE 1 TABLET(25 MG) BY MOUTH DAILY        All past medical history, surgical history, allergies, family history, immunizations andmedications were updated in the EMR today and reviewed under the history and medication portions of their EMR.     No results found for this or any previous visit (from the past 2160 hour(s)).   ROS 14 pt review of systems performed and negative (unless mentioned in an HPI)  Objective: BP 127/67   Pulse 62   Temp 98.1 F (36.7 C)   Ht 5\' 4"  (1.626 m)   Wt 150 lb 12.8 oz (68.4 kg)   LMP 08/07/2015   SpO2 99%   BMI 25.88 kg/m  Physical Exam Vitals and nursing note reviewed.  Constitutional:      General: She is not in acute distress.    Appearance: Normal appearance. She is not ill-appearing or toxic-appearing.  HENT:     Head: Normocephalic and atraumatic.     Right Ear: Tympanic membrane, ear canal and external ear normal. There is no impacted cerumen.     Left Ear: Tympanic membrane,  ear canal and external ear normal. There is no impacted cerumen.     Nose: No congestion or rhinorrhea.     Mouth/Throat:     Mouth: Mucous membranes are moist.     Pharynx: Oropharynx is clear. No oropharyngeal exudate or posterior oropharyngeal erythema.  Eyes:     General: No scleral icterus.       Right eye: No discharge.        Left eye: No discharge.     Extraocular Movements: Extraocular movements intact.     Conjunctiva/sclera: Conjunctivae normal.  Pupils: Pupils are equal, round, and reactive to light.  Cardiovascular:     Rate and Rhythm: Normal rate and regular rhythm.     Pulses: Normal pulses.     Heart sounds: Normal heart sounds. No murmur heard.    No friction rub. No gallop.  Pulmonary:     Effort: Pulmonary effort is normal. No respiratory distress.     Breath sounds: Normal breath sounds. No stridor. No wheezing, rhonchi or rales.  Chest:     Chest wall: No tenderness.  Abdominal:     General: Abdomen is flat. Bowel sounds are normal. There is no distension.     Palpations: Abdomen is soft. There is no mass.     Tenderness: There is no abdominal tenderness. There is no right CVA tenderness, left CVA tenderness, guarding or rebound.     Hernia: No hernia is present.  Musculoskeletal:        General: No swelling, tenderness or deformity. Normal range of motion.     Cervical back: Normal range of motion and neck supple. No rigidity or tenderness.     Right lower leg: No edema.     Left lower leg: No edema.  Lymphadenopathy:     Cervical: No cervical adenopathy.  Skin:    General: Skin is warm and dry.     Coloration: Skin is not jaundiced or pale.     Findings: No bruising, erythema, lesion or rash.  Neurological:     General: No focal deficit present.     Mental Status: She is alert and oriented to person, place, and time. Mental status is at baseline.     Cranial Nerves: No cranial nerve deficit.     Sensory: No sensory deficit.     Motor: No weakness.      Coordination: Coordination normal.     Gait: Gait normal.     Deep Tendon Reflexes: Reflexes normal.  Psychiatric:        Mood and Affect: Mood normal.        Behavior: Behavior normal.        Thought Content: Thought content normal.        Judgment: Judgment normal.      No results found.  Assessment/plan: Brianna Conrad is a 67 y.o. female present for CPE and Chronic Conditions/illness Management Essential hypertension, benign Hyperlipidemia LDL goal <100 Left carotid bruit Heart murmur Stable Continue losartan 50 mg QD Continue HCTZ to 25 mg.  Continue fenofibrate Continue statin - Encouraged baby aspirin, if not taking NSAIDS routinely.  - Low-sodium diet, routine exercise encouraged. - Heart murmur present since childhood - Carotid Doppler studies completed January 2018, minimal  heterogeneous plaque, with no hemodynamically significant stenosis by duplex criteria and extracranial cerebrovascular circulation. CBC, CMP, lipid, TSH collected today - Follow-up in 5.5 months  Need for vaccination for zoster Shingrix No. 2 completed today Routine general medical examination at a health care facility Patient was encouraged to exercise greater than 150 minutes a week. Patient was encouraged to choose a diet filled with fresh fruits and vegetables, and lean meats. AVS provided to patient today for education/recommendation on gender specific health and safety maintenance. A1c collected today Colonoscopy: fhx brother 56, personal history of colon polyps. Last colonoscopy 2022, 5 years recall. [facility]- Dr. Glennon Hamilton  Mammogram: No fhx, UTD 2023/Dr. Adkins> requested Cervical cancer screening: Last Pap smear 2023.  Dr. [name].  Immunizations: Tdap UTD 2017. Flu encouraged yearly, covid utd, shingrix #2 completed today. Infectious disease screening:  HIV and hepatitis C completed Dexa at GYN  Return in about 24 weeks (around 04/09/2023) for Routine chronic condition  follow-up.   Orders Placed This Encounter  Procedures   Varicella-zoster vaccine IM   CBC   Comprehensive metabolic panel   Hemoglobin A1c   Lipid panel   TSH   Meds ordered this encounter  Medications   atorvastatin (LIPITOR) 20 MG tablet    Sig: TAKE 1 TABLET(10 MG) BY MOUTH in evening    Dispense:  90 tablet    Refill:  1   fenofibrate 54 MG tablet    Sig: Take 1 tablet (54 mg total) by mouth every morning.    Dispense:  90 tablet    Refill:  1   hydrochlorothiazide (HYDRODIURIL) 25 MG tablet    Sig: Take 1 tablet (25 mg total) by mouth daily.    Dispense:  90 tablet    Refill:  1   losartan (COZAAR) 50 MG tablet    Sig: TAKE 1 TABLET(25 MG) BY MOUTH DAILY    Dispense:  90 tablet    Refill:  1   Referral Orders  No referral(s) requested today     Electronically signed by: Howard Pouch, [place]

## 2022-10-23 NOTE — Patient Instructions (Addendum)
  Return in about 24 weeks (around 04/09/2023) for Routine chronic condition follow-up.        Great to see you today.  I have refilled the medication(s) we provide.   If labs were collected, we will inform you of lab results once received either by echart message or telephone call.   - echart message- for normal results that have been seen by the patient already.   - telephone call: abnormal results or if patient has not viewed results in their echart.

## 2022-10-31 DIAGNOSIS — Z01419 Encounter for gynecological examination (general) (routine) without abnormal findings: Secondary | ICD-10-CM | POA: Diagnosis not present

## 2022-10-31 DIAGNOSIS — Z124 Encounter for screening for malignant neoplasm of cervix: Secondary | ICD-10-CM | POA: Diagnosis not present

## 2022-10-31 DIAGNOSIS — Z1151 Encounter for screening for human papillomavirus (HPV): Secondary | ICD-10-CM | POA: Diagnosis not present

## 2022-10-31 DIAGNOSIS — Z6825 Body mass index (BMI) 25.0-25.9, adult: Secondary | ICD-10-CM | POA: Diagnosis not present

## 2022-10-31 DIAGNOSIS — Z13820 Encounter for screening for osteoporosis: Secondary | ICD-10-CM | POA: Diagnosis not present

## 2022-10-31 DIAGNOSIS — Z1231 Encounter for screening mammogram for malignant neoplasm of breast: Secondary | ICD-10-CM | POA: Diagnosis not present

## 2022-10-31 LAB — HM MAMMOGRAPHY

## 2022-10-31 LAB — HM DEXA SCAN: HM Dexa Scan: NORMAL

## 2023-03-22 ENCOUNTER — Encounter (INDEPENDENT_AMBULATORY_CARE_PROVIDER_SITE_OTHER): Payer: Self-pay

## 2023-05-02 ENCOUNTER — Ambulatory Visit: Payer: BC Managed Care – PPO | Admitting: Family Medicine

## 2023-05-02 ENCOUNTER — Encounter: Payer: Self-pay | Admitting: Family Medicine

## 2023-05-02 VITALS — BP 130/60 | HR 56 | Temp 98.1°F | Wt 155.8 lb

## 2023-05-02 DIAGNOSIS — I10 Essential (primary) hypertension: Secondary | ICD-10-CM

## 2023-05-02 DIAGNOSIS — Z23 Encounter for immunization: Secondary | ICD-10-CM | POA: Diagnosis not present

## 2023-05-02 DIAGNOSIS — Z79899 Other long term (current) drug therapy: Secondary | ICD-10-CM | POA: Diagnosis not present

## 2023-05-02 DIAGNOSIS — Z5181 Encounter for therapeutic drug level monitoring: Secondary | ICD-10-CM | POA: Diagnosis not present

## 2023-05-02 DIAGNOSIS — E785 Hyperlipidemia, unspecified: Secondary | ICD-10-CM

## 2023-05-02 MED ORDER — HYDROCHLOROTHIAZIDE 25 MG PO TABS: 25.0000 mg | ORAL_TABLET | Freq: Every day | ORAL | 1 refills | Status: DC

## 2023-05-02 MED ORDER — FENOFIBRATE 54 MG PO TABS: 54.0000 mg | ORAL_TABLET | Freq: Every morning | ORAL | 1 refills | Status: DC

## 2023-05-02 MED ORDER — LOSARTAN POTASSIUM 50 MG PO TABS: ORAL_TABLET | ORAL | 1 refills | Status: DC

## 2023-05-02 MED ORDER — ATORVASTATIN CALCIUM 20 MG PO TABS: ORAL_TABLET | ORAL | 1 refills | Status: DC

## 2023-05-02 NOTE — Patient Instructions (Addendum)
Return in about 6 months (around 10/25/2023) for cpe (20 min), Routine chronic condition follow-up.        Great to see you today.  I have refilled the medication(s) we provide.   If labs were collected or images ordered, we will inform you of  results once we have received them and reviewed. We will contact you either by echart message, or telephone call.  Please give ample time to the testing facility, and our office to run,  receive and review results. Please do not call inquiring of results, even if you can see them in your chart. We will contact you as soon as we are able. If it has been over 1 week since the test was completed, and you have not yet heard from [place], then please call us.    - echart message- for normal results that have been seen by the patient already.   - telephone call: abnormal results or if patient has not viewed results in their echart.  If a referral to a specialist was entered for you, please call us in 2 weeks if you have not heard from the specialist office to schedule.

## 2023-05-02 NOTE — Progress Notes (Signed)
Patient ID: Brianna Conrad, female  DOB: 1956/05/20, 67 y.o.   MRN: [id number] Patient Care Team    Relationship Specialty Notifications Start End  Natalia Leatherwood, DO PCP - General Family Medicine  07/14/15   Zelphia Cairo, MD Consulting Physician Obstetrics and Gynecology  08/16/16   [facility]    08/31/20    Comment: Dr. Lavonia Dana, Kristine Garbe, MD Referring Physician Gastroenterology  09/20/20     Chief Complaint  Patient presents with   Hypertension    Subjective: Brianna Conrad is a 67 y.o.  Female  present for Chronic Conditions/illness Management  All past medical history, surgical history, allergies, family history, immunizations, medications and social history were updated in the electronic medical record today. All recent labs, ED visits and hospitalizations within the last year were reviewed.  Hypertension/left carotid bruit/HLD/heart murmur::  Pt reports compliance with HCTZ 25 mg QD and losartan 50 mg qd  Patient denies chest pain, shortness of breath, dizziness or lower extremity edema.  Pt does not take a daily baby ASA. Pt is  prescribed statin and fenofibrate. Diet: Low-sodium Exercise: Not routinely RF: Hypertension, hyperlipidemia, family history stroke     05/02/2023    8:10 AM 10/23/2022    8:23 AM 10/25/2021    8:07 AM 08/31/2020   10:02 AM 05/26/2019    8:54 AM  Depression screen PHQ 2/9  Decreased Interest 0 0 0 0 0  Down, Depressed, Hopeless 0 0 0 0 0  PHQ - 2 Score 0 0 0 0 0      10/23/2022    8:23 AM  GAD 7 : Generalized Anxiety Score  Nervous, Anxious, on Edge 0  Control/stop worrying 0  Worry too much - different things 0  Trouble relaxing 0  Restless 0  Easily annoyed or irritable 0  Afraid - awful might happen 0  Total GAD 7 Score 0    Immunization History  Administered Date(s) Administered   Fluad Trivalent(High Dose 65+) 05/02/2023   Fluzone Influenza virus vaccine,trivalent (IIV3), split virus 06/25/2013    Influenza Inj Mdck Quad Pf 07/15/2018   Influenza,inj,Quad PF,6+ Mos 05/26/2019, 05/04/2021   Influenza-Unspecified 06/25/2013, 07/01/2014, 05/04/2021   Moderna Sars-Covid-2 Vaccination 10/24/2019, 11/25/2019   PNEUMOCOCCAL CONJUGATE-20 04/17/2022   Tdap 08/18/2015   Unspecified SARS-COV-2 Vaccination 10/24/2019, 11/25/2019, 05/20/2021   Zoster Recombinant(Shingrix) 04/17/2022, 10/23/2022    Past Medical History:  Diagnosis Date   Atypical glandular cells on Pap smear    Basal cell carcinoma    Cancer ([facility]) 2014   basal cell skin cancer   Diverticulosis    History of chicken pox    History of colon polyps    BENIGN  2013   History of squamous cell carcinoma excision    RIGHT SHOULDER   Hyperlipidemia    Hypertension    RMSF [facility] spotted fever) 08/21/2016   Vaginal delivery [id number],[id number], 1993   Allergies  Allergen Reactions   Amoxicillin Rash   Penicillins Rash   Past Surgical History:  Procedure Laterality Date   CERVICAL CONIZATION W/BX N/A 06/20/2013   Procedure: CONIZATION CERVIX WITH BIOPSY;  Surgeon: Zelphia Cairo, MD;  Location: [facility];  Service: Gynecology;  Laterality: N/A;   COLONOSCOPY W/ POLYPECTOMY  09/2011   HYSTEROSCOPY WITH D & C N/A 06/20/2013   Procedure: DILATATION AND CURETTAGE /HYSTEROSCOPY;  Surgeon: Zelphia Cairo, MD;  Location: [facility];  Service: Gynecology;  Laterality: N/A;   SKIN BIOPSY  TUBAL LIGATION  1993   POST PARTUM   Family History  Problem Relation Age of Onset   Diabetes Father    Colon cancer Brother 81   Gallbladder disease Mother    Cancer Mother    Diabetes Paternal Uncle    Diabetes Paternal Grandmother    Stroke Paternal Grandfather    Social History   Social History Narrative   Married, Tom. 3 children.   Retired Emergency planning/management officer). College degree.    Drinks caffeinated beverages.   Wears her seatbelt, exercises at least 3 times a week, smoke detectors in the home.   No firearms in the home.   Feels safe in  her relationships.    Allergies as of 05/02/2023       Reactions   Amoxicillin Rash   Penicillins Rash        Medication List        Accurate as of May 02, 2023  8:25 AM. If you have any questions, ask your nurse or doctor.          atorvastatin 20 MG tablet Commonly known as: LIPITOR TAKE 1 TABLET(10 MG) BY MOUTH in evening   fenofibrate 54 MG tablet Take 1 tablet (54 mg total) by mouth every morning.   hydrochlorothiazide 25 MG tablet Commonly known as: HYDRODIURIL Take 1 tablet (25 mg total) by mouth daily.   losartan 50 MG tablet Commonly known as: COZAAR TAKE 1 TABLET(25 MG) BY MOUTH DAILY        All past medical history, surgical history, allergies, family history, immunizations andmedications were updated in the EMR today and reviewed under the history and medication portions of their EMR.     No results found for this or any previous visit (from the past 2160 hour(s)).   ROS 14 pt review of systems performed and negative (unless mentioned in an HPI)  Objective: BP 130/60   Pulse (!) 56   Temp 98.1 F (36.7 C)   Wt 155 lb 12.8 oz (70.7 kg)   LMP 08/07/2015   SpO2 100%   BMI 26.74 kg/m  Physical Exam Vitals and nursing note reviewed.  Constitutional:      General: She is not in acute distress.    Appearance: Normal appearance. She is not ill-appearing, toxic-appearing or diaphoretic.  HENT:     Head: Normocephalic and atraumatic.  Eyes:     General: No scleral icterus.       Right eye: No discharge.        Left eye: No discharge.     Extraocular Movements: Extraocular movements intact.     Conjunctiva/sclera: Conjunctivae normal.     Pupils: Pupils are equal, round, and reactive to light.  Cardiovascular:     Rate and Rhythm: Normal rate and regular rhythm.     Heart sounds: Murmur heard.  Pulmonary:     Effort: Pulmonary effort is normal. No respiratory distress.     Breath sounds: Normal breath sounds. No wheezing, rhonchi or  rales.  Musculoskeletal:     Right lower leg: No edema.     Left lower leg: No edema.  Skin:    General: Skin is warm.     Findings: No rash.  Neurological:     Mental Status: She is alert and oriented to person, place, and time. Mental status is at baseline.     Motor: No weakness.     Gait: Gait normal.  Psychiatric:        Mood and Affect:  Mood normal.        Behavior: Behavior normal.        Thought Content: Thought content normal.        Judgment: Judgment normal.     No results found.  Assessment/plan: Abrah Kurz is a 67 y.o. female present for Chronic Conditions/illness Management Essential hypertension, benign Hyperlipidemia LDL goal <100 Left carotid bruit Heart murmur stable Continue losartan 50 mg QD Continue HCTZ to 25 mg.  Continue fenofibrate Continue statin - Encouraged baby aspirin, if not taking NSAIDS routinely.  - Low-sodium diet, routine exercise encouraged. - Heart murmur present since childhood - Carotid Doppler studies completed January 2018, minimal  heterogeneous plaque, with no hemodynamically significant stenosis by duplex criteria and extracranial cerebrovascular circulation. Labs up-to-date, due next visit  Influenza vaccine updated today  Return in about 6 months (around 10/25/2023) for cpe (20 min), Routine chronic condition follow-up.   Orders Placed This Encounter  Procedures   Flu Vaccine Trivalent High Dose (Fluad)   Meds ordered this encounter  Medications   losartan (COZAAR) 50 MG tablet    Sig: TAKE 1 TABLET(25 MG) BY MOUTH DAILY    Dispense:  90 tablet    Refill:  1   hydrochlorothiazide (HYDRODIURIL) 25 MG tablet    Sig: Take 1 tablet (25 mg total) by mouth daily.    Dispense:  90 tablet    Refill:  1   atorvastatin (LIPITOR) 20 MG tablet    Sig: TAKE 1 TABLET(10 MG) BY MOUTH in evening    Dispense:  90 tablet    Refill:  1   fenofibrate 54 MG tablet    Sig: Take 1 tablet (54 mg total) by mouth every morning.     Dispense:  90 tablet    Refill:  1   Referral Orders  No referral(s) requested today    Electronically signed by: Felix Pacini, DO [facility] [place]

## 2023-10-30 ENCOUNTER — Ambulatory Visit: Payer: BC Managed Care – PPO | Admitting: Family Medicine

## 2023-10-30 ENCOUNTER — Encounter: Payer: Self-pay | Admitting: Family Medicine

## 2023-10-30 VITALS — BP 130/78 | HR 74 | Temp 98.2°F | Ht 64.0 in | Wt 162.6 lb

## 2023-10-30 DIAGNOSIS — M7742 Metatarsalgia, left foot: Secondary | ICD-10-CM

## 2023-10-30 DIAGNOSIS — E785 Hyperlipidemia, unspecified: Secondary | ICD-10-CM | POA: Diagnosis not present

## 2023-10-30 DIAGNOSIS — I10 Essential (primary) hypertension: Secondary | ICD-10-CM | POA: Diagnosis not present

## 2023-10-30 DIAGNOSIS — Z0000 Encounter for general adult medical examination without abnormal findings: Secondary | ICD-10-CM

## 2023-10-30 DIAGNOSIS — M7989 Other specified soft tissue disorders: Secondary | ICD-10-CM

## 2023-10-30 DIAGNOSIS — H9192 Unspecified hearing loss, left ear: Secondary | ICD-10-CM | POA: Diagnosis not present

## 2023-10-30 DIAGNOSIS — H6992 Unspecified Eustachian tube disorder, left ear: Secondary | ICD-10-CM | POA: Diagnosis not present

## 2023-10-30 LAB — TSH: TSH: 2.32 u[IU]/mL (ref 0.35–5.50)

## 2023-10-30 LAB — COMPREHENSIVE METABOLIC PANEL
ALT: 27 U/L (ref 0–35)
AST: 19 U/L (ref 0–37)
Albumin: 4.8 g/dL (ref 3.5–5.2)
Alkaline Phosphatase: 63 U/L (ref 39–117)
BUN: 21 mg/dL (ref 6–23)
CO2: 33 meq/L — ABNORMAL HIGH (ref 19–32)
Calcium: 9.8 mg/dL (ref 8.4–10.5)
Chloride: 98 meq/L (ref 96–112)
Creatinine, Ser: 0.85 mg/dL (ref 0.40–1.20)
GFR: 71.32 mL/min (ref >60.00)
Glucose, Bld: 110 mg/dL — ABNORMAL HIGH (ref 70–99)
Potassium: 3.7 meq/L (ref 3.5–5.1)
Sodium: 140 meq/L (ref 135–145)
Total Bilirubin: 1 mg/dL (ref 0.2–1.2)
Total Protein: 6.9 g/dL (ref 6.0–8.3)

## 2023-10-30 LAB — CBC
HCT: 43.2 % (ref 36.0–46.0)
Hemoglobin: 14.6 g/dL (ref 12.0–15.0)
MCHC: 33.7 g/dL (ref 30.0–36.0)
MCV: 93.5 fl (ref 78.0–100.0)
Platelets: 193 10*3/uL (ref 150.0–400.0)
RBC: 4.63 Mil/uL (ref 3.87–5.11)
RDW: 12.4 % (ref 11.5–15.5)
WBC: 5.3 10*3/uL (ref 4.0–10.5)

## 2023-10-30 LAB — LIPID PANEL
Cholesterol: 148 mg/dL (ref 0–200)
HDL: 49.2 mg/dL (ref >39.00)
LDL Cholesterol: 75 mg/dL (ref 0–99)
NonHDL: 98.89
Total CHOL/HDL Ratio: 3
Triglycerides: 121 mg/dL (ref 0.0–149.0)
VLDL: 24.2 mg/dL (ref 0.0–40.0)

## 2023-10-30 LAB — HEMOGLOBIN A1C: Hgb A1c MFr Bld: 5.6 % (ref 4.6–6.5)

## 2023-10-30 MED ORDER — FENOFIBRATE 54 MG PO TABS
54.0000 mg | ORAL_TABLET | Freq: Every morning | ORAL | 1 refills | Status: DC
Start: 2023-10-30 — End: 2024-05-12

## 2023-10-30 MED ORDER — METHYLPREDNISOLONE ACETATE 80 MG/ML IJ SUSP
80.0000 mg | Freq: Once | INTRAMUSCULAR | Status: AC
  Administered 2023-10-30: 80 mg via INTRAMUSCULAR

## 2023-10-30 MED ORDER — ATORVASTATIN CALCIUM 20 MG PO TABS
ORAL_TABLET | ORAL | 1 refills | Status: DC
Start: 2023-10-30 — End: 2024-05-12

## 2023-10-30 MED ORDER — LOSARTAN POTASSIUM 50 MG PO TABS: ORAL_TABLET | ORAL | 1 refills | Status: DC

## 2023-10-30 MED ORDER — HYDROCHLOROTHIAZIDE 25 MG PO TABS: 25.0000 mg | ORAL_TABLET | Freq: Every day | ORAL | 1 refills | Status: DC

## 2023-10-30 NOTE — Progress Notes (Unsigned)
 Patient ID: Brianna Conrad, female  DOB: 1955-09-10, 68 y.o.   MRN: [id number] Patient Care Team    Relationship Specialty Notifications Start End  Natalia Leatherwood, DO PCP - General Family Medicine  07/14/15   Zelphia Cairo, MD Consulting Physician Obstetrics and Gynecology  08/16/16   [facility]    08/31/20    Comment: Dr. Lavonia Dana, Kristine Garbe, MD Referring Physician Gastroenterology  09/20/20     Chief Complaint  Patient presents with   Annual Exam    Subjective: Brianna Conrad is a 68 y.o.  Female  present for C follow up PE and chronic Conditions/illness Management  All past medical history, surgical history, allergies, family history, immunizations, medications and social history were updated in the electronic medical record today. All recent labs, ED visits and hospitalizations within the last year were reviewed.  Health maintenance:  Colonoscopy: fhx brother 81, personal history of colon polyps. Last colonoscopy 2022, 5 years recall. [facility]- Dr. Jason Fila  Mammogram: No fhx, UTD Dr. Renaldo Fiddler requested 2024 again and she has 2025 scheduled next week Immunizations: Tdap UTD 2017. Flu encouraged yearly, covid utd, shingrix series completed Infectious disease screening: HIV and hepatitis C completed Dexa: Completed 10/05/2020 at GYN-requested again- pt states normal Patient has a Dental home. Hospitalizations/ED visits: Reviewed  Hypertension/left carotid bruit/HLD/heart murmur::  Pt reports clients with HCTZ 25 mg QD and losartan 50 mg qd  Patient denies chest pain, shortness of breath, dizziness or lower extremity edema.  Pt does not take a daily baby ASA. Pt is  prescribed statin and fenofibrate. Diet: Low-sodium Exercise: Not routinely RF: Hypertension, hyperlipidemia, family history stroke     10/30/2023    8:16 AM 05/02/2023    8:10 AM 10/23/2022    8:23 AM 10/25/2021    8:07 AM 08/31/2020   10:02 AM  Depression screen PHQ 2/9  Decreased Interest 0 0 0 0  0  Down, Depressed, Hopeless 0 0 0 0 0  PHQ - 2 Score 0 0 0 0 0  Altered sleeping 0      Tired, decreased energy 0      Change in appetite 0      Feeling bad or failure about yourself  0      Trouble concentrating 0      Moving slowly or fidgety/restless 0      Suicidal thoughts 0      PHQ-9 Score 0      Difficult doing work/chores Not difficult at all          10/23/2022    8:23 AM  GAD 7 : Generalized Anxiety Score  Nervous, Anxious, on Edge 0  Control/stop worrying 0  Worry too much - different things 0  Trouble relaxing 0  Restless 0  Easily annoyed or irritable 0  Afraid - awful might happen 0  Total GAD 7 Score 0    Immunization History  Administered Date(s) Administered   Fluad Trivalent(High Dose 65+) 05/02/2023   Fluzone Influenza virus vaccine,trivalent (IIV3), split virus 06/25/2013   Influenza Inj Mdck Quad Pf 07/15/2018   Influenza,inj,Quad PF,6+ Mos 05/26/2019, 05/04/2021   Influenza-Unspecified 06/25/2013, 07/01/2014, 05/04/2021   Moderna Sars-Covid-2 Vaccination 10/24/2019, 11/25/2019   PNEUMOCOCCAL CONJUGATE-20 04/17/2022   Tdap 08/18/2015   Unspecified SARS-COV-2 Vaccination 10/24/2019, 11/25/2019, 05/20/2021   Zoster Recombinant(Shingrix) 04/17/2022, 10/23/2022    Past Medical History:  Diagnosis Date   Atypical glandular cells on Pap smear    Basal cell carcinoma  Cancer (HCC) 2014   basal cell skin cancer   Diverticulosis    History of chicken pox    History of colon polyps    BENIGN  2013   History of squamous cell carcinoma excision    RIGHT SHOULDER   Hyperlipidemia    Hypertension    RMSF [facility] spotted fever) 08/21/2016   Vaginal delivery [id number], 1993   Allergies  Allergen Reactions   Amoxicillin Rash   Penicillins Rash   Past Surgical History:  Procedure Laterality Date   CERVICAL CONIZATION W/BX N/A 06/20/2013   Procedure: CONIZATION CERVIX WITH BIOPSY;  Surgeon: Zelphia Cairo, MD;  Location: [facility];  Service:  Gynecology;  Laterality: N/A;   COLONOSCOPY W/ POLYPECTOMY  09/2011   HYSTEROSCOPY WITH D & C N/A 06/20/2013   Procedure: DILATATION AND CURETTAGE /HYSTEROSCOPY;  Surgeon: Zelphia Cairo, MD;  Location: [facility];  Service: Gynecology;  Laterality: N/A;   SKIN BIOPSY     TUBAL LIGATION  1993   POST PARTUM   Family History  Problem Relation Age of Onset   Diabetes Father    Colon cancer Brother 72   Gallbladder disease Mother    Cancer Mother    Diabetes Paternal Uncle    Diabetes Paternal Grandmother    Stroke Paternal Grandfather    Social History   Social History Narrative   Married, Tom. 3 children.   Retired Emergency planning/management officer). College degree.    Drinks caffeinated beverages.   Wears her seatbelt, exercises at least 3 times a week, smoke detectors in the home.   No firearms in the home.   Feels safe in her relationships.    Allergies as of 10/30/2023       Reactions   Amoxicillin Rash   Penicillins Rash        Medication List        Accurate as of October 30, 2023  8:48 AM. If you have any questions, ask your nurse or doctor.          atorvastatin 20 MG tablet Commonly known as: LIPITOR TAKE 1 TABLET(10 MG) BY MOUTH in evening   fenofibrate 54 MG tablet Take 1 tablet (54 mg total) by mouth every morning.   hydrochlorothiazide 25 MG tablet Commonly known as: HYDRODIURIL Take 1 tablet (25 mg total) by mouth daily.   losartan 50 MG tablet Commonly known as: COZAAR TAKE 1 TABLET(25 MG) BY MOUTH DAILY        All past medical history, surgical history, allergies, family history, immunizations andmedications were updated in the EMR today and reviewed under the history and medication portions of their EMR.     No results found for this or any previous visit (from the past 2160 hours).   ROS 14 pt review of systems performed and negative (unless mentioned in an HPI)  Objective: BP 130/78   Pulse 74   Temp 98.2 F (36.8 C)   Ht 5\' 4"  (1.626 m)   Wt 162  lb 9.6 oz (73.8 kg)   LMP 08/07/2015   SpO2 98%   BMI 27.91 kg/m  Physical Exam Vitals and nursing note reviewed.  Constitutional:      General: She is not in acute distress.    Appearance: Normal appearance. She is not ill-appearing, toxic-appearing or diaphoretic.  HENT:     Head: Normocephalic and atraumatic.     Right Ear: Tympanic membrane, ear canal and external ear normal. There is no impacted cerumen.     Left  Ear: Tympanic membrane, ear canal and external ear normal. There is no impacted cerumen.     Nose: No congestion or rhinorrhea.     Mouth/Throat:     Mouth: Mucous membranes are moist.     Pharynx: Oropharynx is clear. No oropharyngeal exudate or posterior oropharyngeal erythema.  Eyes:     General: No scleral icterus.       Right eye: No discharge.        Left eye: No discharge.     Extraocular Movements: Extraocular movements intact.     Conjunctiva/sclera: Conjunctivae normal.     Pupils: Pupils are equal, round, and reactive to light.  Cardiovascular:     Rate and Rhythm: Normal rate and regular rhythm.     Pulses: Normal pulses.     Heart sounds: Murmur heard.     No friction rub. No gallop.  Pulmonary:     Effort: Pulmonary effort is normal. No respiratory distress.     Breath sounds: Normal breath sounds. No stridor. No wheezing, rhonchi or rales.  Chest:     Chest wall: No tenderness.  Abdominal:     General: Abdomen is flat. Bowel sounds are normal. There is no distension.     Palpations: Abdomen is soft. There is no mass.     Tenderness: There is no abdominal tenderness. There is no right CVA tenderness, left CVA tenderness, guarding or rebound.     Hernia: No hernia is present.  Musculoskeletal:        General: No swelling, tenderness or deformity. Normal range of motion.     Cervical back: Normal range of motion and neck supple. No rigidity or tenderness.     Right lower leg: No edema.     Left lower leg: No edema.  Lymphadenopathy:      Cervical: No cervical adenopathy.  Skin:    General: Skin is warm and dry.     Coloration: Skin is not jaundiced or pale.     Findings: No bruising, erythema, lesion or rash.  Neurological:     General: No focal deficit present.     Mental Status: She is alert and oriented to person, place, and time. Mental status is at baseline.     Cranial Nerves: No cranial nerve deficit.     Sensory: No sensory deficit.     Motor: No weakness.     Coordination: Coordination normal.     Gait: Gait normal.     Deep Tendon Reflexes: Reflexes normal.  Psychiatric:        Mood and Affect: Mood normal.        Behavior: Behavior normal.        Thought Content: Thought content normal.        Judgment: Judgment normal.     No results found.  Assessment/plan: Lorieann Argueta is a 68 y.o. female present for CPE and chronic Conditions/illness Management Essential hypertension, benign Hyperlipidemia LDL goal <100 Left carotid bruit Heart murmur Stable  continue losartan 50 mg QD Continue HCTZ to 25 mg.  Continue fenofibrate Continue statin - Low-sodium diet, routine exercise encouraged. - Heart murmur present since childhood - Carotid Doppler studies completed January 2018, minimal  heterogeneous plaque, with no hemodynamically significant stenosis by duplex criteria and extracranial cerebrovascular circulation. Lipid and A1c collected today CT cardiac screen ordered today  Eustachian tube dysfunction, left/Decreased hearing, left New problem ***  Swelling of left index finger New problem ***  Metatarsalgia, left foot New problem ***  Routine  general medical examination at a health care facility (Primary) - CBC - Comprehensive metabolic panel - TSH Patient was encouraged to exercise greater than 150 minutes a week. Patient was encouraged to choose a diet filled with fresh fruits and vegetables, and lean meats. AVS provided to patient today for education/recommendation on gender specific  health and safety maintenance. Colonoscopy: fhx brother 60, personal history of colon polyps. Last colonoscopy 2022, 5 years recall. [facility]- Dr. Jason Fila  Mammogram: No fhx, UTD Dr. Renaldo Fiddler requested 2024 again and she has 2025 scheduled next week Immunizations: Tdap UTD 2017. Flu encouraged yearly, covid utd, shingrix series completed Infectious disease screening: HIV and hepatitis C completed Dexa: Completed 10/05/2020 at GYN-requested again- pt states normal Patient has a Dental home. Hospitalizations/ED visits: Reviewed  Return in about 24 weeks (around 04/15/2024) for Routine chronic condition follow-up.  43 minutes spent in addition to annual preventive exam on day of service, covering chronic condition management/medications/image orders and 3 new acute problems discussed, evaluated and treated  Orders Placed This Encounter  Procedures   CT CARDIAC SCORING (SELF PAY ONLY)   CBC   Comprehensive metabolic panel   Hemoglobin A1c   Lipid panel   TSH   Meds ordered this encounter  Medications   atorvastatin (LIPITOR) 20 MG tablet    Sig: TAKE 1 TABLET(10 MG) BY MOUTH in evening    Dispense:  90 tablet    Refill:  1   fenofibrate 54 MG tablet    Sig: Take 1 tablet (54 mg total) by mouth every morning.    Dispense:  90 tablet    Refill:  1   hydrochlorothiazide (HYDRODIURIL) 25 MG tablet    Sig: Take 1 tablet (25 mg total) by mouth daily.    Dispense:  90 tablet    Refill:  1   losartan (COZAAR) 50 MG tablet    Sig: TAKE 1 TABLET(25 MG) BY MOUTH DAILY    Dispense:  90 tablet    Refill:  1   Referral Orders  No referral(s) requested today    Electronically signed by: Felix Pacini, DO [facility] [place]

## 2023-10-30 NOTE — Patient Instructions (Signed)

## 2023-10-30 NOTE — Progress Notes (Unsigned)
 Patient ID: Brianna Conrad, female  DOB: Jun 23, 1956, 68 y.o.   MRN: [id number] Patient Care Team    Relationship Specialty Notifications Start End  Natalia Leatherwood, DO PCP - General Family Medicine  07/14/15   Zelphia Cairo, MD Consulting Physician Obstetrics and Gynecology  08/16/16   [facility]    08/31/20    Comment: Dr. Lavonia Dana, Kristine Garbe, MD Referring Physician Gastroenterology  09/20/20     Chief Complaint  Patient presents with   Annual Exam    Subjective: Brianna Conrad is a 68 y.o.  Female  present for C follow up PE and chronic Conditions/illness Management  All past medical history, surgical history, allergies, family history, immunizations, medications and social history were updated in the electronic medical record today. All recent labs, ED visits and hospitalizations within the last year were reviewed.  Health maintenance:  Colonoscopy: fhx brother 64, personal history of colon polyps. Last colonoscopy 2022, 5 years recall. [facility]- Dr. Jason Fila  Mammogram: No fhx, UTD Dr. Renaldo Fiddler requested 2024 again and she has 2025 scheduled next week Immunizations: Tdap UTD 2017. Flu encouraged yearly, covid utd, shingrix series completed Infectious disease screening: HIV and hepatitis C completed Dexa: Completed 10/05/2020 at GYN-requested again- pt states normal Patient has a Dental home. Hospitalizations/ED visits: Reviewed  Hypertension/left carotid bruit/HLD/heart murmur::  Pt reports clients with HCTZ 25 mg QD and losartan 50 mg qd  Patient denies chest pain, shortness of breath, dizziness or lower extremity edema.  Pt does not take a daily baby ASA. Pt is  prescribed statin and fenofibrate. Diet: Low-sodium Exercise: Not routinely RF: Hypertension, hyperlipidemia, family history stroke     10/30/2023    8:16 AM 05/02/2023    8:10 AM 10/23/2022    8:23 AM 10/25/2021    8:07 AM 08/31/2020   10:02 AM  Depression screen PHQ 2/9  Decreased Interest 0 0 0 0  0  Down, Depressed, Hopeless 0 0 0 0 0  PHQ - 2 Score 0 0 0 0 0  Altered sleeping 0      Tired, decreased energy 0      Change in appetite 0      Feeling bad or failure about yourself  0      Trouble concentrating 0      Moving slowly or fidgety/restless 0      Suicidal thoughts 0      PHQ-9 Score 0      Difficult doing work/chores Not difficult at all          10/23/2022    8:23 AM  GAD 7 : Generalized Anxiety Score  Nervous, Anxious, on Edge 0  Control/stop worrying 0  Worry too much - different things 0  Trouble relaxing 0  Restless 0  Easily annoyed or irritable 0  Afraid - awful might happen 0  Total GAD 7 Score 0    Immunization History  Administered Date(s) Administered   Fluad Trivalent(High Dose 65+) 05/02/2023   Fluzone Influenza virus vaccine,trivalent (IIV3), split virus 06/25/2013   Influenza Inj Mdck Quad Pf 07/15/2018   Influenza,inj,Quad PF,6+ Mos 05/26/2019, 05/04/2021   Influenza-Unspecified 06/25/2013, 07/01/2014, 05/04/2021   Moderna Sars-Covid-2 Vaccination 10/24/2019, 11/25/2019   PNEUMOCOCCAL CONJUGATE-20 04/17/2022   Tdap 08/18/2015   Unspecified SARS-COV-2 Vaccination 10/24/2019, 11/25/2019, 05/20/2021   Zoster Recombinant(Shingrix) 04/17/2022, 10/23/2022    Past Medical History:  Diagnosis Date   Atypical glandular cells on Pap smear    Basal cell carcinoma  Cancer (HCC) 2014   basal cell skin cancer   Diverticulosis    History of chicken pox    History of colon polyps    BENIGN  2013   History of squamous cell carcinoma excision    RIGHT SHOULDER   Hyperlipidemia    Hypertension    RMSF [facility] spotted fever) 08/21/2016   Vaginal delivery [id number], 1993   Allergies  Allergen Reactions   Amoxicillin Rash   Penicillins Rash   Past Surgical History:  Procedure Laterality Date   CERVICAL CONIZATION W/BX N/A 06/20/2013   Procedure: CONIZATION CERVIX WITH BIOPSY;  Surgeon: Zelphia Cairo, MD;  Location: [facility];  Service:  Gynecology;  Laterality: N/A;   COLONOSCOPY W/ POLYPECTOMY  09/2011   HYSTEROSCOPY WITH D & C N/A 06/20/2013   Procedure: DILATATION AND CURETTAGE /HYSTEROSCOPY;  Surgeon: Zelphia Cairo, MD;  Location: [facility];  Service: Gynecology;  Laterality: N/A;   SKIN BIOPSY     TUBAL LIGATION  1993   POST PARTUM   Family History  Problem Relation Age of Onset   Diabetes Father    Colon cancer Brother 46   Gallbladder disease Mother    Cancer Mother    Diabetes Paternal Uncle    Diabetes Paternal Grandmother    Stroke Paternal Grandfather    Social History   Social History Narrative   Married, Tom. 3 children.   Retired Emergency planning/management officer). College degree.    Drinks caffeinated beverages.   Wears her seatbelt, exercises at least 3 times a week, smoke detectors in the home.   No firearms in the home.   Feels safe in her relationships.    Allergies as of 10/30/2023       Reactions   Amoxicillin Rash   Penicillins Rash        Medication List        Accurate as of October 30, 2023  8:24 AM. If you have any questions, ask your nurse or doctor.          atorvastatin 20 MG tablet Commonly known as: LIPITOR TAKE 1 TABLET(10 MG) BY MOUTH in evening   fenofibrate 54 MG tablet Take 1 tablet (54 mg total) by mouth every morning.   hydrochlorothiazide 25 MG tablet Commonly known as: HYDRODIURIL Take 1 tablet (25 mg total) by mouth daily.   losartan 50 MG tablet Commonly known as: COZAAR TAKE 1 TABLET(25 MG) BY MOUTH DAILY        All past medical history, surgical history, allergies, family history, immunizations andmedications were updated in the EMR today and reviewed under the history and medication portions of their EMR.     No results found for this or any previous visit (from the past 2160 hours).   ROS 14 pt review of systems performed and negative (unless mentioned in an HPI)  Objective: BP 130/78   Pulse 74   Temp 98.2 F (36.8 C)   Ht 5\' 4"  (1.626 m)   Wt 162  lb 9.6 oz (73.8 kg)   LMP 08/07/2015   SpO2 98%   BMI 27.91 kg/m  Physical Exam Vitals and nursing note reviewed.  Constitutional:      General: She is not in acute distress.    Appearance: Normal appearance. She is not ill-appearing, toxic-appearing or diaphoretic.  HENT:     Head: Normocephalic and atraumatic.     Right Ear: Tympanic membrane, ear canal and external ear normal. There is no impacted cerumen.     Left  Ear: Tympanic membrane, ear canal and external ear normal. There is no impacted cerumen.     Nose: No congestion or rhinorrhea.     Mouth/Throat:     Mouth: Mucous membranes are moist.     Pharynx: Oropharynx is clear. No oropharyngeal exudate or posterior oropharyngeal erythema.  Eyes:     General: No scleral icterus.       Right eye: No discharge.        Left eye: No discharge.     Extraocular Movements: Extraocular movements intact.     Conjunctiva/sclera: Conjunctivae normal.     Pupils: Pupils are equal, round, and reactive to light.  Cardiovascular:     Rate and Rhythm: Normal rate and regular rhythm.     Pulses: Normal pulses.     Heart sounds: Murmur heard.     No friction rub. No gallop.  Pulmonary:     Effort: Pulmonary effort is normal. No respiratory distress.     Breath sounds: Normal breath sounds. No stridor. No wheezing, rhonchi or rales.  Chest:     Chest wall: No tenderness.  Abdominal:     General: Abdomen is flat. Bowel sounds are normal. There is no distension.     Palpations: Abdomen is soft. There is no mass.     Tenderness: There is no abdominal tenderness. There is no right CVA tenderness, left CVA tenderness, guarding or rebound.     Hernia: No hernia is present.  Musculoskeletal:        General: No swelling, tenderness or deformity. Normal range of motion.     Cervical back: Normal range of motion and neck supple. No rigidity or tenderness.     Right lower leg: No edema.     Left lower leg: No edema.  Lymphadenopathy:      Cervical: No cervical adenopathy.  Skin:    General: Skin is warm and dry.     Coloration: Skin is not jaundiced or pale.     Findings: No bruising, erythema, lesion or rash.  Neurological:     General: No focal deficit present.     Mental Status: She is alert and oriented to person, place, and time. Mental status is at baseline.     Cranial Nerves: No cranial nerve deficit.     Sensory: No sensory deficit.     Motor: No weakness.     Coordination: Coordination normal.     Gait: Gait normal.     Deep Tendon Reflexes: Reflexes normal.  Psychiatric:        Mood and Affect: Mood normal.        Behavior: Behavior normal.        Thought Content: Thought content normal.        Judgment: Judgment normal.     No results found.  Assessment/plan: Gillian Meeuwsen is a 68 y.o. female present for CPE and chronic Conditions/illness Management Essential hypertension, benign Hyperlipidemia LDL goal <100 Left carotid bruit Heart murmur Stable  continue losartan 50 mg QD Continue HCTZ to 25 mg.  Continue fenofibrate Continue statin - Low-sodium diet, routine exercise encouraged. - Heart murmur present since childhood - Carotid Doppler studies completed January 2018, minimal  heterogeneous plaque, with no hemodynamically significant stenosis by duplex criteria and extracranial cerebrovascular circulation. Lipid and A1c collected today   *** Patient was encouraged to exercise greater than 150 minutes a week. Patient was encouraged to choose a diet filled with fresh fruits and vegetables, and lean meats. AVS provided to  patient today for education/recommendation on gender specific health and safety maintenance. Colonoscopy: fhx brother 63, personal history of colon polyps. Last colonoscopy 2022, 5 years recall. [facility]- Dr. Jason Fila  Mammogram: No fhx, UTD Dr. Renaldo Fiddler requested 2024 again and she has 2025 scheduled next week Immunizations: Tdap UTD 2017. Flu encouraged yearly, covid utd,  shingrix series completed Infectious disease screening: HIV and hepatitis C completed Dexa: Completed 10/05/2020 at GYN-requested again- pt states normal Patient has a Dental home. Hospitalizations/ED visits: Reviewed  Return in about 24 weeks (around 04/15/2024) for Routine chronic condition follow-up.   Orders Placed This Encounter  Procedures   CBC   Comprehensive metabolic panel   Hemoglobin A1c   Lipid panel   TSH   Meds ordered this encounter  Medications   atorvastatin (LIPITOR) 20 MG tablet    Sig: TAKE 1 TABLET(10 MG) BY MOUTH in evening    Dispense:  90 tablet    Refill:  1   fenofibrate 54 MG tablet    Sig: Take 1 tablet (54 mg total) by mouth every morning.    Dispense:  90 tablet    Refill:  1   hydrochlorothiazide (HYDRODIURIL) 25 MG tablet    Sig: Take 1 tablet (25 mg total) by mouth daily.    Dispense:  90 tablet    Refill:  1   losartan (COZAAR) 50 MG tablet    Sig: TAKE 1 TABLET(25 MG) BY MOUTH DAILY    Dispense:  90 tablet    Refill:  1   Referral Orders  No referral(s) requested today    Electronically signed by: Felix Pacini, DO [facility] [place]

## 2023-10-31 ENCOUNTER — Encounter: Payer: Self-pay | Admitting: Family Medicine

## 2023-11-06 DIAGNOSIS — Z6827 Body mass index (BMI) 27.0-27.9, adult: Secondary | ICD-10-CM | POA: Diagnosis not present

## 2023-11-06 DIAGNOSIS — Z01419 Encounter for gynecological examination (general) (routine) without abnormal findings: Secondary | ICD-10-CM | POA: Diagnosis not present

## 2023-11-06 DIAGNOSIS — Z1231 Encounter for screening mammogram for malignant neoplasm of breast: Secondary | ICD-10-CM | POA: Diagnosis not present

## 2023-11-08 ENCOUNTER — Telehealth: Payer: Self-pay

## 2023-11-08 DIAGNOSIS — H6992 Unspecified Eustachian tube disorder, left ear: Secondary | ICD-10-CM

## 2023-11-08 DIAGNOSIS — H9192 Unspecified hearing loss, left ear: Secondary | ICD-10-CM

## 2023-11-08 NOTE — Telephone Encounter (Signed)
 Reason for CRM: Patient states the steroid shot given isn't helping with the blockage in her ears. Patient states the left ear is worse than the other and wanting to know if there's anything else that can be done or could she possibly be referred out to a specialist. Patient call back number is [phone]    Please advise. Pt seen 10/30/23.

## 2023-11-08 NOTE — Addendum Note (Signed)
 Addended by: Felix Pacini A on: 11/08/2023 11:24 AM   Modules accepted: Orders

## 2023-11-08 NOTE — Telephone Encounter (Signed)
 Please inform patient I have placed referral to ENT for her.  They will reach out to get her scheduled.  Would encourage her to continue taking the Flonase nasal spray, which is over-the-counter.

## 2023-11-08 NOTE — Telephone Encounter (Signed)
 Pt made aware

## 2023-11-09 ENCOUNTER — Encounter (INDEPENDENT_AMBULATORY_CARE_PROVIDER_SITE_OTHER): Payer: Self-pay | Admitting: Otolaryngology

## 2023-11-21 DIAGNOSIS — H903 Sensorineural hearing loss, bilateral: Secondary | ICD-10-CM | POA: Diagnosis not present

## 2023-11-21 DIAGNOSIS — H9312 Tinnitus, left ear: Secondary | ICD-10-CM | POA: Diagnosis not present

## 2023-11-30 ENCOUNTER — Encounter (INDEPENDENT_AMBULATORY_CARE_PROVIDER_SITE_OTHER): Payer: Self-pay | Admitting: Otolaryngology

## 2023-12-14 ENCOUNTER — Ambulatory Visit (HOSPITAL_BASED_OUTPATIENT_CLINIC_OR_DEPARTMENT_OTHER)
Admission: RE | Admit: 2023-12-14 | Discharge: 2023-12-14 | Disposition: A | Discharge status: Home / Self Care | Payer: Self-pay | Source: Ambulatory Visit | Attending: Family Medicine | Admitting: Family Medicine

## 2023-12-14 DIAGNOSIS — I10 Essential (primary) hypertension: Secondary | ICD-10-CM | POA: Insufficient documentation

## 2023-12-14 DIAGNOSIS — E785 Hyperlipidemia, unspecified: Secondary | ICD-10-CM | POA: Insufficient documentation

## 2023-12-20 ENCOUNTER — Ambulatory Visit: Payer: Self-pay | Admitting: Family Medicine

## 2023-12-20 DIAGNOSIS — R931 Abnormal findings on diagnostic imaging of heart and coronary circulation: Secondary | ICD-10-CM | POA: Insufficient documentation

## 2023-12-20 NOTE — Telephone Encounter (Signed)
 Please call patient Her cardiac CT score result has returned with a higher risk score. Fortunately, she is already on a statin medication   Patients with a risk above the 75th percentile, should be referred to cardiology.  If she is agreeable to referral we will place this for her today.

## 2023-12-24 DIAGNOSIS — L814 Other melanin hyperpigmentation: Secondary | ICD-10-CM | POA: Diagnosis not present

## 2023-12-24 DIAGNOSIS — D225 Melanocytic nevi of trunk: Secondary | ICD-10-CM | POA: Diagnosis not present

## 2023-12-24 DIAGNOSIS — L728 Other follicular cysts of the skin and subcutaneous tissue: Secondary | ICD-10-CM | POA: Diagnosis not present

## 2023-12-24 DIAGNOSIS — L821 Other seborrheic keratosis: Secondary | ICD-10-CM | POA: Diagnosis not present

## 2023-12-25 DIAGNOSIS — H903 Sensorineural hearing loss, bilateral: Secondary | ICD-10-CM | POA: Diagnosis not present

## 2024-01-14 DIAGNOSIS — H903 Sensorineural hearing loss, bilateral: Secondary | ICD-10-CM | POA: Diagnosis not present

## 2024-05-12 NOTE — Progress Notes (Unsigned)
 ***  Patient ID: Brianna Conrad, female  DOB: 1956/01/10, 68 y.o.   MRN: [id number] Patient Care Team    Relationship Specialty Notifications Start End  Catherine Charlies LABOR, DO PCP - General Family Medicine  07/14/15   Latisha Medford, MD Consulting Physician Obstetrics and Gynecology  08/16/16   [facility]    08/31/20    Comment: Dr. Dyann Queen, Elsie BROCKS, MD Referring Physician Gastroenterology  09/20/20     No chief complaint on file.   Subjective: Brianna Conrad is a 68 y.o.  Female  present for  CPE and chronic Conditions/illness Management, patient also has multiple new acute concerns she would like to discuss today  All past medical history, surgical history, allergies, family history, immunizations, medications and social history were updated in the electronic medical record today. All recent labs, ED visits and hospitalizations within the last year were reviewed.   Hypertension/left carotid bruit/HLD/heart murmur::  Pt reports clients with HCTZ 25 mg QD and losartan  50 mg qd  Patient denies chest pain, shortness of breath, dizziness or lower extremity edema.  Pt does not take a daily baby ASA. Pt is  prescribed statin and fenofibrate . Diet: Low-sodium Exercise: Not routinely RF: Hypertension, hyperlipidemia, family history stroke  Left ear: Patient reports she has noticed decreased hearing in her left ear.  She reports it sounds muffled. Left index: Patient reports she noticed within the last day a small area at the joint of her left index finger has become swollen.  She does not feel it is tender.  She was pulling weeds recently, but does not recall injuring herself.  She states the weeds she was pulling did not have thorns.  Right foot: Patient reports she noted swelling along her right medial first metatarsal head.  It was a little tender over this location.  Now the swelling seems to have moved between the first and second toes.     10/30/2023    8:16 AM  05/02/2023    8:10 AM 10/23/2022    8:23 AM 10/25/2021    8:07 AM 08/31/2020   10:02 AM  Depression screen PHQ 2/9  Decreased Interest 0 0 0 0 0  Down, Depressed, Hopeless 0 0 0 0 0  PHQ - 2 Score 0 0 0 0 0  Altered sleeping 0      Tired, decreased energy 0      Change in appetite 0      Feeling bad or failure about yourself  0      Trouble concentrating 0      Moving slowly or fidgety/restless 0      Suicidal thoughts 0      PHQ-9 Score 0      Difficult doing work/chores Not difficult at all          10/23/2022    8:23 AM  GAD 7 : Generalized Anxiety Score  Nervous, Anxious, on Edge 0  Control/stop worrying 0  Worry too much - different things 0  Trouble relaxing 0  Restless 0  Easily annoyed or irritable 0  Afraid - awful might happen 0  Total GAD 7 Score 0    Immunization History  Administered Date(s) Administered   Fluad Trivalent(High Dose 65+) 05/02/2023   Fluzone Influenza virus vaccine,trivalent (IIV3), split virus 06/25/2013   Influenza Inj Mdck Quad Pf 07/15/2018   Influenza,inj,Quad PF,6+ Mos 05/26/2019, 05/04/2021   Influenza-Unspecified 06/25/2013, 07/01/2014, 05/04/2021   Moderna Sars-Covid-2 Vaccination 10/24/2019, 11/25/2019   PNEUMOCOCCAL  CONJUGATE-20 04/17/2022   Tdap 08/18/2015   Unspecified SARS-COV-2 Vaccination 10/24/2019, 11/25/2019, 05/20/2021   Zoster Recombinant(Shingrix ) 04/17/2022, 10/23/2022    Past Medical History:  Diagnosis Date   Atypical glandular cells on Pap smear    Basal cell carcinoma    Cancer (HCC) 2014   basal cell skin cancer   Diverticulosis    History of chicken pox    History of colon polyps    BENIGN  2013   History of squamous cell carcinoma excision    RIGHT SHOULDER   Hyperlipidemia    Hypertension    R[facility] spotted fever) 08/21/2016   Vaginal delivery 8015,8007, 1993   Allergies  Allergen Reactions   Amoxicillin Rash   Penicillins Rash   Past Surgical History:  Procedure Laterality Date    CERVICAL CONIZATION W/BX N/A 06/20/2013   Procedure: CONIZATION CERVIX WITH BIOPSY;  Surgeon: Truman Corona, MD;  Location: [facility];  Service: Gynecology;  Laterality: N/A;   COLONOSCOPY W/ POLYPECTOMY  09/2011   HYSTEROSCOPY WITH D & C N/A 06/20/2013   Procedure: DILATATION AND CURETTAGE /HYSTEROSCOPY;  Surgeon: Truman Corona, MD;  Location: [facility];  Service: Gynecology;  Laterality: N/A;   SKIN BIOPSY     TUBAL LIGATION  1993   POST PARTUM   Family History  Problem Relation Age of Onset   Diabetes Father    Colon cancer Brother 19   Gallbladder disease Mother    Cancer Mother    Diabetes Paternal Uncle    Diabetes Paternal Grandmother    Stroke Paternal Grandfather    Social History   Social History Narrative   Married, Tom. 3 children.   Retired Emergency planning/management officer). College degree.    Drinks caffeinated beverages.   Wears her seatbelt, exercises at least 3 times a week, smoke detectors in the home.   No firearms in the home.   Feels safe in her relationships.    Allergies as of 05/13/2024       Reactions   Amoxicillin Rash   Penicillins Rash        Medication List        Accurate as of May 12, 2024  2:25 PM. If you have any questions, ask your nurse or doctor.          atorvastatin  20 MG tablet Commonly known as: LIPITOR TAKE 1 TABLET(10 MG) BY MOUTH in evening   fenofibrate  54 MG tablet Take 1 tablet (54 mg total) by mouth every morning.   hydrochlorothiazide  25 MG tablet Commonly known as: HYDRODIURIL  Take 1 tablet (25 mg total) by mouth daily.   losartan  50 MG tablet Commonly known as: COZAAR  TAKE 1 TABLET(25 MG) BY MOUTH DAILY        All past medical history, surgical history, allergies, family history, immunizations andmedications were updated in the EMR today and reviewed under the history and medication portions of their EMR.     No results found for this or any previous visit (from the past 2160 hours).    ROS 14 pt review of  systems performed and negative (unless mentioned in an HPI)  Objective: LMP 08/07/2015  Physical Exam Vitals and nursing note reviewed.  Constitutional:      General: She is not in acute distress.    Appearance: Normal appearance. She is not ill-appearing, toxic-appearing or diaphoretic.  HENT:     Head: Normocephalic and atraumatic.     Right Ear: Tympanic membrane, ear canal and external ear normal. No tenderness. There  is no impacted cerumen. No mastoid tenderness. Tympanic membrane is not erythematous, retracted or bulging.     Left Ear: Ear canal and external ear normal. No tenderness. A middle ear effusion is present. There is no impacted cerumen. No mastoid tenderness. Tympanic membrane is not erythematous, retracted or bulging.     Nose: No congestion or rhinorrhea.     Mouth/Throat:     Mouth: Mucous membranes are moist.     Pharynx: Oropharynx is clear. No oropharyngeal exudate or posterior oropharyngeal erythema.  Eyes:     General: No scleral icterus.       Right eye: No discharge.        Left eye: No discharge.     Extraocular Movements: Extraocular movements intact.     Conjunctiva/sclera: Conjunctivae normal.     Pupils: Pupils are equal, round, and reactive to light.  Cardiovascular:     Rate and Rhythm: Normal rate and regular rhythm.     Pulses: Normal pulses.     Heart sounds: Murmur heard.     No friction rub. No gallop.  Pulmonary:     Effort: Pulmonary effort is normal. No respiratory distress.     Breath sounds: Normal breath sounds. No stridor. No wheezing, rhonchi or rales.  Chest:     Chest wall: No tenderness.  Abdominal:     General: Abdomen is flat. Bowel sounds are normal. There is no distension.     Palpations: Abdomen is soft. There is no mass.     Tenderness: There is no abdominal tenderness. There is no right CVA tenderness, left CVA tenderness, guarding or rebound.     Hernia: No hernia is present.  Musculoskeletal:        General: No  swelling, tenderness or deformity. Normal range of motion.     Left hand: Swelling present. No tenderness or bony tenderness. Normal range of motion. Normal pulse.     Cervical back: Normal range of motion and neck supple. No rigidity or tenderness.     Right lower leg: No edema.     Left lower leg: No edema.     Left foot: Normal range of motion. Swelling and bunion present. No tenderness or bony tenderness.     Comments: Left index: Very mild area of erythema about the size of a pencil eraser located for DIP joint.  No fluctuance or cystic mass appreciated.  Small area of abrasion at this location. Left foot: Very mild hallux valgus with bunion.  Currently no tenderness at this level.  There is a small amount of swelling between the first and second toes.   Lymphadenopathy:     Cervical: No cervical adenopathy.  Skin:    General: Skin is warm and dry.     Coloration: Skin is not jaundiced or pale.     Findings: No bruising, erythema, lesion or rash.  Neurological:     General: No focal deficit present.     Mental Status: She is alert and oriented to person, place, and time. Mental status is at baseline.     Cranial Nerves: No cranial nerve deficit.     Sensory: No sensory deficit.     Motor: No weakness.     Coordination: Coordination normal.     Gait: Gait normal.     Deep Tendon Reflexes: Reflexes normal.  Psychiatric:        Mood and Affect: Mood normal.        Behavior: Behavior normal.  Thought Content: Thought content normal.        Judgment: Judgment normal.     No results found.  Assessment/plan: Dametria Tuzzolino is a 68 y.o. female present for CPE and chronic Conditions/illness Management Essential hypertension, benign Hyperlipidemia LDL goal <100 Left carotid bruit Heart murmur Stable  continue losartan  50 mg QD Continue HCTZ to 25 mg.  Continue fenofibrate  Continue statin - Low-sodium diet, routine exercise encouraged. - Heart murmur present since  childhood - Carotid Doppler studies completed January 2018, minimal  heterogeneous plaque, with no hemodynamically significant stenosis by duplex criteria and extracranial cerebrovascular circulation. Lipid and A1c collected today CT cardiac screen we discussed and ordered today  Eustachian tube dysfunction, left/Decreased hearing, left New problem Discussed eustachian tube dysfunction.  She has a mild middle ear effusion. Start Flonase nasal spray daily IM Depo-Medrol  80 mg provided today  Swelling of left index finger New problem Keep clean and dry, and continue to monitor.  It appears there is a very small abrasion over this area where I believe she had cut herself while gardening.  There does not seem to be any evidence for foreign body, and the plant she was working with did not have thorns. Continue to monitor, if becomes painful, more swollen red, drainage follow-up at that time.  Metatarsalgia, left foot New problem IM Depo-Medrol  80 mg provided today Initial irritation was either from the bunion, arthritis flare but may be bursitis location being medial first metatarsal head.  Seems the original inflammation and discomfort has resolved but inflammation now present between first and second toe. Keep feet elevated when able.  Hopefully the steroid shot will help calm down any inflammatory response that is occurring. If symptoms worsen, follow-up at that time.   Routine general medical examination at a health care facility (Primary) - CBC - Comprehensive metabolic panel - TSH Patient was encouraged to exercise greater than 150 minutes a week. Patient was encouraged to choose a diet filled with fresh fruits and vegetables, and lean meats. AVS provided to patient today for education/recommendation on gender specific health and safety maintenance. Colonoscopy: fhx brother 94, personal history of colon polyps. Last colonoscopy 2022, 5 years recall. [facility]- Dr. Lucio  Mammogram:  No fhx, UTD Dr. Pearline requested 2024 again and she has 2025 scheduled next week Immunizations: Tdap UTD 2017. Flu encouraged yearly, covid utd, shingrix  series completed Infectious disease screening: HIV and hepatitis C completed Dexa: Completed 10/05/2020 at [facility]-requested again- pt states normal Patient has a Dental home. Hospitalizations/ED visits: Reviewed  No follow-ups on file.  43 minutes spent in addition to annual preventive exam on day of service, covering chronic condition management/medications/image orders and 3 new acute problems discussed, evaluated and treated  No orders of the defined types were placed in this encounter.  No orders of the defined types were placed in this encounter.  Referral Orders  No referral(s) requested today    Electronically signed by: Charlies Bellini, DO [facility]

## 2024-05-13 ENCOUNTER — Encounter: Payer: Self-pay | Admitting: Family Medicine

## 2024-05-13 ENCOUNTER — Ambulatory Visit: Admitting: Family Medicine

## 2024-05-13 VITALS — BP 128/72 | HR 73 | Temp 98.2°F | Wt 168.6 lb

## 2024-05-13 DIAGNOSIS — R931 Abnormal findings on diagnostic imaging of heart and coronary circulation: Secondary | ICD-10-CM

## 2024-05-13 DIAGNOSIS — I10 Essential (primary) hypertension: Secondary | ICD-10-CM

## 2024-05-13 DIAGNOSIS — Z5181 Encounter for therapeutic drug level monitoring: Secondary | ICD-10-CM | POA: Diagnosis not present

## 2024-05-13 DIAGNOSIS — Z23 Encounter for immunization: Secondary | ICD-10-CM | POA: Diagnosis not present

## 2024-05-13 DIAGNOSIS — E785 Hyperlipidemia, unspecified: Secondary | ICD-10-CM | POA: Diagnosis not present

## 2024-05-13 DIAGNOSIS — Z79899 Other long term (current) drug therapy: Secondary | ICD-10-CM

## 2024-05-13 MED ORDER — HYDROCHLOROTHIAZIDE 25 MG PO TABS: 25.0000 mg | ORAL_TABLET | Freq: Every day | ORAL | 1 refills | Status: AC

## 2024-05-13 MED ORDER — FENOFIBRATE 54 MG PO TABS: 54.0000 mg | ORAL_TABLET | Freq: Every morning | ORAL | 1 refills | Status: AC

## 2024-05-13 MED ORDER — ATORVASTATIN CALCIUM 20 MG PO TABS: ORAL_TABLET | ORAL | 1 refills | Status: AC

## 2024-05-13 MED ORDER — LOSARTAN POTASSIUM 50 MG PO TABS: ORAL_TABLET | ORAL | 1 refills | Status: AC

## 2024-05-13 NOTE — Patient Instructions (Addendum)

## 2024-11-03 ENCOUNTER — Encounter: Admitting: Family Medicine
